# Patient Record
Sex: Female | Born: 1970 | Race: White | Hispanic: No | Marital: Single | Smoking: Never smoker
Health system: Southern US, Community
[De-identification: ages and names within clinical notes are randomized; demographics above are authoritative.]

## PROBLEM LIST (undated history)

## (undated) DIAGNOSIS — F419 Anxiety disorder, unspecified: Secondary | ICD-10-CM

## (undated) DIAGNOSIS — Z9889 Other specified postprocedural states: Secondary | ICD-10-CM

## (undated) DIAGNOSIS — F488 Other specified nonpsychotic mental disorders: Secondary | ICD-10-CM

## (undated) DIAGNOSIS — R112 Nausea with vomiting, unspecified: Secondary | ICD-10-CM

## (undated) HISTORY — PX: CHOLECYSTECTOMY: SHX55

## (undated) HISTORY — PX: AUGMENTATION MAMMAPLASTY: SUR837

## (undated) HISTORY — PX: ABDOMINAL HYSTERECTOMY: SHX81

## (undated) HISTORY — PX: DIAGNOSTIC LAPAROSCOPY: SUR761

## (undated) HISTORY — PX: BREAST ENHANCEMENT SURGERY: SHX7

---

## 2016-03-21 DIAGNOSIS — M654 Radial styloid tenosynovitis [de Quervain]: Secondary | ICD-10-CM | POA: Diagnosis not present

## 2016-04-17 DIAGNOSIS — L918 Other hypertrophic disorders of the skin: Secondary | ICD-10-CM | POA: Diagnosis not present

## 2016-04-17 DIAGNOSIS — L710 Perioral dermatitis: Secondary | ICD-10-CM | POA: Diagnosis not present

## 2016-08-21 DIAGNOSIS — N926 Irregular menstruation, unspecified: Secondary | ICD-10-CM | POA: Diagnosis not present

## 2016-08-21 DIAGNOSIS — N760 Acute vaginitis: Secondary | ICD-10-CM | POA: Diagnosis not present

## 2016-08-21 DIAGNOSIS — N72 Inflammatory disease of cervix uteri: Secondary | ICD-10-CM | POA: Diagnosis not present

## 2016-09-04 DIAGNOSIS — N72 Inflammatory disease of cervix uteri: Secondary | ICD-10-CM | POA: Diagnosis not present

## 2016-09-26 DIAGNOSIS — N72 Inflammatory disease of cervix uteri: Secondary | ICD-10-CM | POA: Diagnosis not present

## 2016-10-09 ENCOUNTER — Encounter (HOSPITAL_BASED_OUTPATIENT_CLINIC_OR_DEPARTMENT_OTHER): Payer: Self-pay | Admitting: *Deleted

## 2016-10-21 NOTE — Progress Notes (Signed)
LM on VM for Jenna Chaney, scheduler to inform Dr. Helane Rima that orders needed to be placed in EPIC!

## 2016-10-28 ENCOUNTER — Encounter (HOSPITAL_COMMUNITY)
Admission: RE | Admit: 2016-10-28 | Discharge: 2016-10-28 | Disposition: A | Discharge status: Home / Self Care | Payer: Self-pay | Source: Ambulatory Visit | Attending: Obstetrics and Gynecology | Admitting: Obstetrics and Gynecology

## 2016-10-28 DIAGNOSIS — N870 Mild cervical dysplasia: Secondary | ICD-10-CM | POA: Diagnosis not present

## 2016-10-28 DIAGNOSIS — Z01419 Encounter for gynecological examination (general) (routine) without abnormal findings: Secondary | ICD-10-CM | POA: Diagnosis not present

## 2016-10-28 DIAGNOSIS — N72 Inflammatory disease of cervix uteri: Secondary | ICD-10-CM | POA: Diagnosis not present

## 2016-10-30 DIAGNOSIS — Z1231 Encounter for screening mammogram for malignant neoplasm of breast: Secondary | ICD-10-CM | POA: Diagnosis not present

## 2016-12-06 DIAGNOSIS — M5412 Radiculopathy, cervical region: Secondary | ICD-10-CM | POA: Diagnosis not present

## 2016-12-06 DIAGNOSIS — M542 Cervicalgia: Secondary | ICD-10-CM | POA: Diagnosis not present

## 2016-12-29 NOTE — H&P (Signed)
46 year old G 0 presents for LAVH, BS. She reports chronic vaginal discharge, irregular bleeding and copious amounts of vaginal bleeding very time she has intercourse. Pelvic ultrasound normal Cervical biopsy +HPV She has been given multiple rounds of antibiotics without improvement.  No past medical history on file. . Past Surgical History: Breast augmentation  Allergic to codeine and tramadol  Prior to Admission medications   [place]sel[place], amitriptyline  Family history unremarkable  ROS above  VSS General alert and oriented Lung CTAB Car RRR Abdomen is soft and non tender Pelvic uterus is small Cervix is slightly friable  IMPRESSION: Abnormal uterine bleeding HPV Chronic cervicitis  PLAN: LAVH BS Consent signed Risks reviewed

## 2016-12-30 DIAGNOSIS — J0100 Acute maxillary sinusitis, unspecified: Secondary | ICD-10-CM | POA: Diagnosis not present

## 2017-01-08 DIAGNOSIS — N72 Inflammatory disease of cervix uteri: Secondary | ICD-10-CM | POA: Diagnosis not present

## 2017-01-09 ENCOUNTER — Encounter (HOSPITAL_COMMUNITY)
Admission: RE | Admit: 2017-01-09 | Discharge: 2017-01-09 | Disposition: A | Discharge status: Home / Self Care | Payer: 59 | Source: Ambulatory Visit | Attending: Obstetrics and Gynecology | Admitting: Obstetrics and Gynecology

## 2017-01-09 ENCOUNTER — Encounter (HOSPITAL_COMMUNITY): Payer: Self-pay

## 2017-01-09 DIAGNOSIS — N939 Abnormal uterine and vaginal bleeding, unspecified: Secondary | ICD-10-CM | POA: Insufficient documentation

## 2017-01-09 DIAGNOSIS — Z01818 Encounter for other preprocedural examination: Secondary | ICD-10-CM | POA: Diagnosis not present

## 2017-01-09 HISTORY — DX: Anxiety disorder, unspecified: F41.9

## 2017-01-09 HISTORY — DX: Nausea with vomiting, unspecified: R11.2

## 2017-01-09 HISTORY — DX: Other specified postprocedural states: Z98.890

## 2017-01-09 HISTORY — DX: Other specified nonpsychotic mental disorders: F48.8

## 2017-01-09 LAB — CBC
HCT: 35.9 % — ABNORMAL LOW (ref 36.0–46.0)
Hemoglobin: 11.9 g/dL — ABNORMAL LOW (ref 12.0–15.0)
MCH: 32.1 pg (ref 26.0–34.0)
MCHC: 33.1 g/dL (ref 30.0–36.0)
MCV: 96.8 fL (ref 78.0–100.0)
PLATELETS: 293 10*3/uL (ref 150–400)
RBC: 3.71 MIL/uL — AB (ref 3.87–5.11)
RDW: 12.7 % (ref 11.5–15.5)
WBC: 10.1 10*3/uL (ref 4.0–10.5)

## 2017-01-09 LAB — ABO/RH: ABO/RH(D): A POS

## 2017-01-09 NOTE — Patient Instructions (Addendum)
Jenna Chaney  01/09/2017      Jenna Chaney  01/09/2017      Your procedure is scheduled on Monday, Nov. 5, 2018   [facility]   Call this number if you have problems the morning of [phone]              OUR ADDRESS IS [address] , WE ARE LOCATED IN [place].    Remember:  Do not eat food or drink liquids after midnight.   Take these medicines the morning of surgery with A SIP OF WATER:  Lexapro  Do not wear jewelry, make-up or nail polish.  Do not wear lotions, powders, or perfumes, or deoderant.  Do not shave 48 hours prior to surgery.  Men may shave face and neck.  Do not bring valuables to the hospital.  [facility] is not responsible for any belongings or valuables.  Contacts, dentures or bridgework may not be worn into surgery.  Leave your suitcase in the car.  After surgery it may be brought to your room.  For patients admitted to the hospital, discharge time will be determined by your treatment team.   Special instructions:   Please read over the following fact sheets that you were given.                [name] - Preparing for Surgery Before surgery, you can play an important role.  Because skin is not sterile, your skin needs to be as free of germs as possible.  You can reduce the number of germs on your skin by washing with CHG (chlorahexidine gluconate) soap before surgery.  CHG is an antiseptic cleaner which kills germs and bonds with the skin to continue killing germs even after washing. Please DO NOT use if you have an allergy to CHG or antibacterial soaps.  If your skin becomes reddened/irritated stop using the CHG and inform your nurse when you arrive at Short Stay. Do not shave (including legs and underarms) for at least 48 hours prior to the first CHG shower.  You may shave your face/neck. Please follow these instructions carefully:  1.  Shower with  CHG Soap the night before surgery and the  morning of Surgery.  2.  If you choose to wash your hair, wash your hair first as usual with your  normal  shampoo.  3.  After you shampoo, rinse your hair and body thoroughly to remove the  shampoo.                           4.  Use CHG as you would any other liquid soap.  You can apply chg directly  to the skin and wash                       Gently with a scrungie or clean washcloth.  5.  Apply the CHG Soap to your body ONLY FROM THE NECK DOWN.   Do not use on face/ open                           Wound or open sores. Avoid contact with eyes, ears mouth and genitals (private parts).  Wash face,  Genitals (private parts) with your normal soap.             6.  Wash thoroughly, paying special attention to the area where your surgery  will be performed.  7.  Thoroughly rinse your body with warm water from the neck down.  8.  DO NOT shower/wash with your normal soap after using and rinsing off  the CHG Soap.                9.  Pat yourself dry with a clean towel.            10.  Wear clean pajamas.            11.  Place clean sheets on your bed the night of your first shower and do not  sleep with pets. Day of Surgery : Do not apply any lotions/deodorants the morning of surgery.  Please wear clean clothes to the hospital/surgery center.  FAILURE TO FOLLOW THESE INSTRUCTIONS MAY RESULT IN THE CANCELLATION OF YOUR SURGERY PATIENT SIGNATURE_________________________________  NURSE SIGNATURE__________________________________  ________________________________________________________________________   Jenna Chaney  An incentive spirometer is a tool that can help keep your lungs clear and active. This tool measures how well you are filling your lungs with each breath. Taking long deep breaths may help reverse or decrease the chance of developing breathing (pulmonary) problems (especially infection) following:  A long period of  time when you are unable to move or be active. BEFORE THE PROCEDURE   If the spirometer includes an indicator to show your best effort, your nurse or respiratory therapist will set it to a desired goal.  If possible, sit up straight or lean slightly forward. Try not to slouch.  Hold the incentive spirometer in an upright position. INSTRUCTIONS FOR USE  1. Sit on the edge of your bed if possible, or sit up as far as you can in bed or on a chair. 2. Hold the incentive spirometer in an upright position. 3. Breathe out normally. 4. Place the mouthpiece in your mouth and seal your lips tightly around it. 5. Breathe in slowly and as deeply as possible, raising the piston or the ball toward the top of the column. 6. Hold your breath for 3-5 seconds or for as long as possible. Allow the piston or ball to fall to the bottom of the column. 7. Remove the mouthpiece from your mouth and breathe out normally. 8. Rest for a few seconds and repeat Steps 1 through 7 at least 10 times every 1-2 hours when you are awake. Take your time and take a few normal breaths between deep breaths. 9. The spirometer may include an indicator to show your best effort. Use the indicator as a goal to work toward during each repetition. 10. After each set of 10 deep breaths, practice coughing to be sure your lungs are clear. If you have an incision (the cut made at the time of surgery), support your incision when coughing by placing a pillow or rolled up towels firmly against it. Once you are able to get out of bed, walk around indoors and cough well. You may stop using the incentive spirometer when instructed by your caregiver.  RISKS AND COMPLICATIONS  Take your time so you do not get dizzy or light-headed.  If you are in pain, you may need to take or ask for pain medication before doing incentive spirometry. It is harder to take a deep breath if you are having pain.  AFTER USE  Rest and breathe slowly and easily.  It can  be helpful to keep track of a log of your progress. Your caregiver can provide you with a simple table to help with this. If you are using the spirometer at home, follow these instructions: [place] IF:   You are having difficultly using the spirometer.  You have trouble using the spirometer as often as instructed.  Your pain medication is not giving enough relief while using the spirometer.  You develop fever of 100.5 F (38.1 C) or higher. SEEK IMMEDIATE MEDICAL CARE IF:   You cough up bloody sputum that had not been present before.  You develop fever of 102 F (38.9 C) or greater.  You develop worsening pain at or near the incision site. MAKE SURE YOU:   Understand these instructions.  Will watch your condition.  Will get help right away if you are not doing well or get worse. Document Released: 07/08/2006 Document Revised: 05/20/2011 Document Reviewed: 09/08/2006 Exit[employer] Patient Information 2014 Exit[employer], [place].   ________________________________________________________________________  WHAT IS A BLOOD TRANSFUSION? Blood Transfusion Information  A transfusion is the replacement of blood or some of its parts. Blood is made up of multiple cells which provide different functions.  Red blood cells carry oxygen and are used for blood loss replacement.  White blood cells fight against infection.  Platelets control bleeding.  Plasma helps clot blood.  Other blood products are available for specialized needs, such as hemophilia or other clotting disorders. BEFORE THE TRANSFUSION  Who gives blood for transfusions?   Healthy volunteers who are fully evaluated to make sure their blood is safe. This is blood bank blood. Transfusion therapy is the safest it has ever been in the practice of medicine. Before blood is taken from a donor, a complete history is taken to make sure that person has no history of diseases nor engages in risky social behavior (examples are  intravenous drug use or sexual activity with multiple partners). The donor's travel history is screened to minimize risk of transmitting infections, such as malaria. The donated blood is tested for signs of infectious diseases, such as HIV and hepatitis. The blood is then tested to be sure it is compatible with you in order to minimize the chance of a transfusion reaction. If you or a relative donates blood, this is often done in anticipation of surgery and is not appropriate for emergency situations. It takes many days to process the donated blood. RISKS AND COMPLICATIONS Although transfusion therapy is very safe and saves many lives, the main dangers of transfusion include:   Getting an infectious disease.  Developing a transfusion reaction. This is an allergic reaction to something in the blood you were given. Every precaution is taken to prevent this. The decision to have a blood transfusion has been considered carefully by your caregiver before blood is given. Blood is not given unless the benefits outweigh the risks. AFTER THE TRANSFUSION  Right after receiving a blood transfusion, you will usually feel much better and more energetic. This is especially true if your red blood cells have gotten low (anemic). The transfusion raises the level of the red blood cells which carry oxygen, and this usually causes an energy increase.  The nurse administering the transfusion will monitor you carefully for complications. HOME CARE INSTRUCTIONS  No special instructions are needed after a transfusion. You may find your energy is better. Speak with your caregiver about any limitations on activity for underlying diseases  you may have. SEEK MEDICAL CARE IF:   Your condition is not improving after your transfusion.  You develop redness or irritation at the intravenous (IV) site. SEEK IMMEDIATE MEDICAL CARE IF:  Any of the following symptoms occur over the next 12 hours:  Shaking chills.  You have a  temperature by mouth above 102 F (38.9 C), not controlled by medicine.  Chest, back, or muscle pain.  People around you feel you are not acting correctly or are confused.  Shortness of breath or difficulty breathing.  Dizziness and fainting.  You get a rash or develop hives.  You have a decrease in urine output.  Your urine turns a dark color or changes to pink, red, or brown. Any of the following symptoms occur over the next 10 days:  You have a temperature by mouth above 102 F (38.9 C), not controlled by medicine.  Shortness of breath.  Weakness after normal activity.  The white part of the eye turns yellow (jaundice).  You have a decrease in the amount of urine or are urinating less often.  Your urine turns a dark color or changes to pink, red, or brown. Document Released: 02/23/2000 Document Revised: 05/20/2011 Document Reviewed: 10/12/2007 [facility] Patient Information 2014 [place], [place].  _______________________________________________________________________

## 2017-01-13 ENCOUNTER — Ambulatory Visit (HOSPITAL_BASED_OUTPATIENT_CLINIC_OR_DEPARTMENT_OTHER)
Admission: RE | Admit: 2017-01-13 | Discharge: 2017-01-14 | Disposition: A | Discharge status: Home / Self Care | Payer: 59 | Source: Ambulatory Visit | Attending: Obstetrics and Gynecology | Admitting: Obstetrics and Gynecology

## 2017-01-13 ENCOUNTER — Encounter (HOSPITAL_BASED_OUTPATIENT_CLINIC_OR_DEPARTMENT_OTHER): Payer: Self-pay

## 2017-01-13 ENCOUNTER — Ambulatory Visit (HOSPITAL_BASED_OUTPATIENT_CLINIC_OR_DEPARTMENT_OTHER): Payer: 59 | Admitting: Anesthesiology

## 2017-01-13 ENCOUNTER — Encounter (HOSPITAL_BASED_OUTPATIENT_CLINIC_OR_DEPARTMENT_OTHER)
Admission: RE | Disposition: A | Discharge status: Home / Self Care | Payer: Self-pay | Source: Ambulatory Visit | Attending: Obstetrics and Gynecology

## 2017-01-13 DIAGNOSIS — Z885 Allergy status to narcotic agent status: Secondary | ICD-10-CM | POA: Diagnosis not present

## 2017-01-13 DIAGNOSIS — N870 Mild cervical dysplasia: Secondary | ICD-10-CM | POA: Diagnosis not present

## 2017-01-13 DIAGNOSIS — N838 Other noninflammatory disorders of ovary, fallopian tube and broad ligament: Secondary | ICD-10-CM | POA: Diagnosis not present

## 2017-01-13 DIAGNOSIS — R87810 Cervical high risk human papillomavirus (HPV) DNA test positive: Secondary | ICD-10-CM | POA: Diagnosis not present

## 2017-01-13 DIAGNOSIS — N72 Inflammatory disease of cervix uteri: Secondary | ICD-10-CM | POA: Diagnosis not present

## 2017-01-13 DIAGNOSIS — D259 Leiomyoma of uterus, unspecified: Secondary | ICD-10-CM | POA: Diagnosis not present

## 2017-01-13 DIAGNOSIS — N939 Abnormal uterine and vaginal bleeding, unspecified: Secondary | ICD-10-CM | POA: Diagnosis not present

## 2017-01-13 HISTORY — PX: LAPAROSCOPIC VAGINAL HYSTERECTOMY WITH SALPINGECTOMY: SHX6680

## 2017-01-13 LAB — TYPE AND SCREEN
ABO/RH(D): A POS
Antibody Screen: NEGATIVE

## 2017-01-13 LAB — POCT PREGNANCY, URINE: Preg Test, Ur: NEGATIVE

## 2017-01-13 SURGERY — HYSTERECTOMY, VAGINAL, LAPAROSCOPY-ASSISTED, WITH SALPINGECTOMY
Anesthesia: General | Site: Abdomen | Laterality: Bilateral

## 2017-01-13 MED ORDER — PROPOFOL 10 MG/ML IV BOLUS
INTRAVENOUS | Status: AC
  Filled 2017-01-13: qty 40

## 2017-01-13 MED ORDER — SODIUM CHLORIDE 0.9% FLUSH
9.0000 mL | INTRAVENOUS | Status: DC | PRN
  Filled 2017-01-13: qty 10

## 2017-01-13 MED ORDER — ROCURONIUM BROMIDE 10 MG/ML (PF) SYRINGE
PREFILLED_SYRINGE | INTRAVENOUS | Status: DC | PRN
  Administered 2017-01-13: 30 mg via INTRAVENOUS
  Administered 2017-01-13 (×2): 10 mg via INTRAVENOUS

## 2017-01-13 MED ORDER — MIDAZOLAM HCL 2 MG/2ML IJ SOLN
INTRAMUSCULAR | Status: AC
Start: 2017-01-13 — End: 2017-01-13
  Filled 2017-01-13: qty 2

## 2017-01-13 MED ORDER — FENTANYL CITRATE (PF) 100 MCG/2ML IJ SOLN
INTRAMUSCULAR | Status: DC | PRN
  Administered 2017-01-13 (×5): 50 ug via INTRAVENOUS

## 2017-01-13 MED ORDER — DIPHENHYDRAMINE HCL 50 MG/ML IJ SOLN
12.5000 mg | Freq: Four times a day (QID) | INTRAMUSCULAR | Status: DC | PRN
  Filled 2017-01-13: qty 0.25

## 2017-01-13 MED ORDER — METOCLOPRAMIDE HCL 5 MG/ML IJ SOLN
INTRAMUSCULAR | Status: AC
  Filled 2017-01-13: qty 2

## 2017-01-13 MED ORDER — DEXTROSE 5 % IV SOLN
2.0000 g | INTRAVENOUS | Status: AC
  Administered 2017-01-13: 2 g via INTRAVENOUS
  Filled 2017-01-13: qty 2

## 2017-01-13 MED ORDER — SCOPOLAMINE 1 MG/3DAYS TD PT72
1.0000 | MEDICATED_PATCH | TRANSDERMAL | Status: DC
  Administered 2017-01-13: 1.5 mg via TRANSDERMAL
  Filled 2017-01-13: qty 1

## 2017-01-13 MED ORDER — LIDOCAINE 2% (20 MG/ML) 5 ML SYRINGE
INTRAMUSCULAR | Status: DC | PRN
  Administered 2017-01-13: 60 mg via INTRAVENOUS

## 2017-01-13 MED ORDER — ACETAMINOPHEN 10 MG/ML IV SOLN
INTRAVENOUS | Status: AC
  Filled 2017-01-13: qty 100

## 2017-01-13 MED ORDER — DIPHENHYDRAMINE HCL 12.5 MG/5ML PO ELIX
12.5000 mg | ORAL_SOLUTION | Freq: Four times a day (QID) | ORAL | Status: DC | PRN
  Filled 2017-01-13: qty 5

## 2017-01-13 MED ORDER — PROPOFOL 500 MG/50ML IV EMUL
INTRAVENOUS | Status: AC
  Filled 2017-01-13: qty 50

## 2017-01-13 MED ORDER — CEFOTETAN DISODIUM-DEXTROSE 2-2.08 GM-%(50ML) IV SOLR
INTRAVENOUS | Status: AC
  Filled 2017-01-13: qty 50

## 2017-01-13 MED ORDER — LACTATED RINGERS IV SOLN
INTRAVENOUS | Status: DC
  Administered 2017-01-13 (×2): via INTRAVENOUS
  Filled 2017-01-13: qty 1000

## 2017-01-13 MED ORDER — SODIUM CHLORIDE 0.9 % IR SOLN
Status: DC | PRN
  Administered 2017-01-13: 3000 mL

## 2017-01-13 MED ORDER — BUPIVACAINE HCL (PF) 0.25 % IJ SOLN
INTRAMUSCULAR | Status: DC | PRN
  Administered 2017-01-13: 2 mL

## 2017-01-13 MED ORDER — FENTANYL CITRATE (PF) 100 MCG/2ML IJ SOLN
INTRAMUSCULAR | Status: AC
  Filled 2017-01-13: qty 2

## 2017-01-13 MED ORDER — HYDROMORPHONE 1 MG/ML IV SOLN
INTRAVENOUS | Status: DC
  Administered 2017-01-13: 11:00:00 via INTRAVENOUS
  Filled 2017-01-13 (×2): qty 25

## 2017-01-13 MED ORDER — KETOROLAC TROMETHAMINE 30 MG/ML IJ SOLN
30.0000 mg | Freq: Once | INTRAMUSCULAR | Status: AC
  Administered 2017-01-13: 30 mg via INTRAVENOUS
  Filled 2017-01-13: qty 1

## 2017-01-13 MED ORDER — DEXAMETHASONE SODIUM PHOSPHATE 10 MG/ML IJ SOLN
INTRAMUSCULAR | Status: AC
  Filled 2017-01-13: qty 1

## 2017-01-13 MED ORDER — IBUPROFEN 600 MG PO TABS
600.0000 mg | ORAL_TABLET | Freq: Four times a day (QID) | ORAL | Status: DC | PRN
  Filled 2017-01-13: qty 1

## 2017-01-13 MED ORDER — NALOXONE HCL 0.4 MG/ML IJ SOLN
0.4000 mg | INTRAMUSCULAR | Status: DC | PRN
  Filled 2017-01-13: qty 1

## 2017-01-13 MED ORDER — GLYCOPYRROLATE 0.2 MG/ML IV SOSY
PREFILLED_SYRINGE | INTRAVENOUS | Status: DC | PRN
  Administered 2017-01-13: .2 mg via INTRAVENOUS

## 2017-01-13 MED ORDER — ARTIFICIAL TEARS OPHTHALMIC OINT
TOPICAL_OINTMENT | OPHTHALMIC | Status: AC
Start: 2017-01-13 — End: 2017-01-13
  Filled 2017-01-13: qty 3.5

## 2017-01-13 MED ORDER — FENTANYL CITRATE (PF) 100 MCG/2ML IJ SOLN
25.0000 ug | INTRAMUSCULAR | Status: DC | PRN
  Administered 2017-01-13 (×2): 25 ug via INTRAVENOUS
  Administered 2017-01-13: 50 ug via INTRAVENOUS
  Filled 2017-01-13: qty 1

## 2017-01-13 MED ORDER — SUCCINYLCHOLINE CHLORIDE 200 MG/10ML IV SOSY
PREFILLED_SYRINGE | INTRAVENOUS | Status: AC
  Filled 2017-01-13: qty 10

## 2017-01-13 MED ORDER — ROCURONIUM BROMIDE 50 MG/5ML IV SOSY
PREFILLED_SYRINGE | INTRAVENOUS | Status: AC
Start: 2017-01-13 — End: 2017-01-13
  Filled 2017-01-13: qty 5

## 2017-01-13 MED ORDER — ONDANSETRON HCL 4 MG/2ML IJ SOLN
INTRAMUSCULAR | Status: DC | PRN
  Administered 2017-01-13: 4 mg via INTRAVENOUS

## 2017-01-13 MED ORDER — SUCCINYLCHOLINE CHLORIDE 200 MG/10ML IV SOSY
PREFILLED_SYRINGE | INTRAVENOUS | Status: DC | PRN
  Administered 2017-01-13: 100 mg via INTRAVENOUS

## 2017-01-13 MED ORDER — FENTANYL CITRATE (PF) 250 MCG/5ML IJ SOLN
INTRAMUSCULAR | Status: AC
  Filled 2017-01-13: qty 5

## 2017-01-13 MED ORDER — SUGAMMADEX SODIUM 200 MG/2ML IV SOLN
INTRAVENOUS | Status: DC | PRN
  Administered 2017-01-13: 150 mg via INTRAVENOUS

## 2017-01-13 MED ORDER — MENTHOL 3 MG MT LOZG
1.0000 | LOZENGE | OROMUCOSAL | Status: DC | PRN
  Filled 2017-01-13: qty 9

## 2017-01-13 MED ORDER — ACETAMINOPHEN 10 MG/ML IV SOLN
INTRAVENOUS | Status: DC | PRN
  Administered 2017-01-13: 1000 mg via INTRAVENOUS

## 2017-01-13 MED ORDER — MIDAZOLAM HCL 2 MG/2ML IJ SOLN
INTRAMUSCULAR | Status: DC | PRN
  Administered 2017-01-13: 2 mg via INTRAVENOUS

## 2017-01-13 MED ORDER — SUGAMMADEX SODIUM 200 MG/2ML IV SOLN
INTRAVENOUS | Status: AC
  Filled 2017-01-13: qty 2

## 2017-01-13 MED ORDER — GLYCOPYRROLATE 0.2 MG/ML IV SOSY
PREFILLED_SYRINGE | INTRAVENOUS | Status: AC
Start: 2017-01-13 — End: 2017-01-13
  Filled 2017-01-13: qty 5

## 2017-01-13 MED ORDER — LACTATED RINGERS IV SOLN
INTRAVENOUS | Status: DC
  Administered 2017-01-13: 13:00:00 via INTRAVENOUS
  Filled 2017-01-13 (×2): qty 1000

## 2017-01-13 MED ORDER — HYDROMORPHONE HCL 2 MG PO TABS
2.0000 mg | ORAL_TABLET | ORAL | Status: DC | PRN
  Administered 2017-01-13 – 2017-01-14 (×4): 2 mg via ORAL
  Filled 2017-01-13: qty 1

## 2017-01-13 MED ORDER — KETOROLAC TROMETHAMINE 30 MG/ML IJ SOLN
INTRAMUSCULAR | Status: AC
  Filled 2017-01-13: qty 1

## 2017-01-13 MED ORDER — PROMETHAZINE HCL 25 MG/ML IJ SOLN
6.2500 mg | INTRAMUSCULAR | Status: DC | PRN
  Filled 2017-01-13: qty 1

## 2017-01-13 MED ORDER — DEXAMETHASONE SODIUM PHOSPHATE 10 MG/ML IJ SOLN
INTRAMUSCULAR | Status: DC | PRN
  Administered 2017-01-13: 10 mg via INTRAVENOUS

## 2017-01-13 MED ORDER — ONDANSETRON HCL 4 MG/2ML IJ SOLN
INTRAMUSCULAR | Status: AC
  Filled 2017-01-13: qty 2

## 2017-01-13 MED ORDER — LIDOCAINE 2% (20 MG/ML) 5 ML SYRINGE
INTRAMUSCULAR | Status: AC
Start: 2017-01-13 — End: 2017-01-13
  Filled 2017-01-13: qty 10

## 2017-01-13 MED ORDER — METOCLOPRAMIDE HCL 5 MG/ML IJ SOLN
INTRAMUSCULAR | Status: DC | PRN
  Administered 2017-01-13: 5 mg via INTRAVENOUS

## 2017-01-13 MED ORDER — PROPOFOL 500 MG/50ML IV EMUL
INTRAVENOUS | Status: DC | PRN
  Administered 2017-01-13: 150 ug/kg/min via INTRAVENOUS

## 2017-01-13 MED ORDER — SCOPOLAMINE 1 MG/3DAYS TD PT72
MEDICATED_PATCH | TRANSDERMAL | Status: AC
Start: 2017-01-13 — End: 2017-01-13
  Filled 2017-01-13: qty 1

## 2017-01-13 MED ORDER — PROPOFOL 10 MG/ML IV BOLUS
INTRAVENOUS | Status: DC | PRN
  Administered 2017-01-13: 200 mg via INTRAVENOUS
  Administered 2017-01-13: 10 mg via INTRAVENOUS

## 2017-01-13 MED ORDER — HYDROMORPHONE HCL 2 MG PO TABS
ORAL_TABLET | ORAL | Status: AC
Start: 2017-01-13 — End: 2017-01-13
  Filled 2017-01-13: qty 1

## 2017-01-13 MED ORDER — KETOROLAC TROMETHAMINE 30 MG/ML IJ SOLN
INTRAMUSCULAR | Status: DC | PRN
  Administered 2017-01-13: 30 mg via INTRAVENOUS

## 2017-01-13 MED ORDER — HYDROMORPHONE HCL 2 MG PO TABS
ORAL_TABLET | ORAL | Status: AC
  Filled 2017-01-13: qty 1

## 2017-01-13 MED ORDER — ONDANSETRON HCL 4 MG/2ML IJ SOLN
4.0000 mg | Freq: Four times a day (QID) | INTRAMUSCULAR | Status: DC | PRN
  Filled 2017-01-13: qty 2

## 2017-01-13 MED ORDER — ESCITALOPRAM OXALATE 5 MG PO TABS
5.0000 mg | ORAL_TABLET | Freq: Every day | ORAL | Status: DC
  Administered 2017-01-13: 5 mg via ORAL
  Filled 2017-01-13: qty 1

## 2017-01-13 SURGICAL SUPPLY — 59 items
ADH SKN CLS APL DERMABOND .7 (GAUZE/BANDAGES/DRESSINGS) ×2
APL SRG 38 LTWT LNG FL B (MISCELLANEOUS) ×1
APPLICATOR ARISTA FLEXITIP XL (MISCELLANEOUS) ×2 IMPLANT
BARRIER ADHS 3X4 INTERCEED (GAUZE/BANDAGES/DRESSINGS) IMPLANT
BLADE CLIPPER SURG (BLADE) IMPLANT
BRR ADH 4X3 ABS CNTRL BYND (GAUZE/BANDAGES/DRESSINGS)
CANISTER SUCT 3000ML PPV (MISCELLANEOUS) IMPLANT
CLOTH BEACON ORANGE TIMEOUT ST (SAFETY) ×3 IMPLANT
COVER BACK TABLE 80X110 HD (DRAPES) ×3 IMPLANT
COVER MAYO STAND STRL (DRAPES) ×6 IMPLANT
DERMABOND ADVANCED (GAUZE/BANDAGES/DRESSINGS) ×4
DERMABOND ADVANCED .7 DNX12 (GAUZE/BANDAGES/DRESSINGS) ×1 IMPLANT
DRSG COVADERM PLUS 2X2 (GAUZE/BANDAGES/DRESSINGS) IMPLANT
DRSG OPSITE POSTOP 3X4 (GAUZE/BANDAGES/DRESSINGS) ×5 IMPLANT
DURAPREP 26ML APPLICATOR (WOUND CARE) ×3 IMPLANT
ELECT REM PT RETURN 9FT ADLT (ELECTROSURGICAL) ×3
ELECTRODE REM PT RTRN 9FT ADLT (ELECTROSURGICAL) ×1 IMPLANT
GLOVE BIO SURGEON STRL SZ 6 (GLOVE) ×4 IMPLANT
GLOVE BIO SURGEON STRL SZ 6.5 (GLOVE) ×8 IMPLANT
GLOVE BIO SURGEONS STRL SZ 6.5 (GLOVE) ×5
GLOVE BIOGEL PI IND STRL 6.5 (GLOVE) IMPLANT
GLOVE BIOGEL PI IND STRL 7.5 (GLOVE) IMPLANT
GLOVE BIOGEL PI INDICATOR 6.5 (GLOVE) ×6
GLOVE BIOGEL PI INDICATOR 7.5 (GLOVE) ×2
GLOVE ECLIPSE 6.5 STRL STRAW (GLOVE) ×6 IMPLANT
GOWN STRL REUS W/TWL LRG LVL3 (GOWN DISPOSABLE) ×10 IMPLANT
HEMOSTAT ARISTA ABSORB 3G PWDR (MISCELLANEOUS) ×2 IMPLANT
HOLDER FOLEY CATH W/STRAP (MISCELLANEOUS) ×3 IMPLANT
KIT RM TURNOVER CYSTO AR (KITS) ×3 IMPLANT
LEGGING LITHOTOMY PAIR STRL (DRAPES) ×3 IMPLANT
NDL INSUFFLATION 14GA 120MM (NEEDLE) ×1 IMPLANT
NEEDLE INSUFFLATION 14GA 120MM (NEEDLE) ×3 IMPLANT
NS IRRIG 500ML POUR BTL (IV SOLUTION) ×3 IMPLANT
PACK LAVH (CUSTOM PROCEDURE TRAY) ×3 IMPLANT
PACK ROBOTIC GOWN (GOWN DISPOSABLE) ×3 IMPLANT
PACK TRENDGUARD 450 HYBRID PRO (MISCELLANEOUS) IMPLANT
PAD OB MATERNITY 4.3X12.25 (PERSONAL CARE ITEMS) ×3 IMPLANT
PAD PREP 24X48 CUFFED NSTRL (MISCELLANEOUS) ×3 IMPLANT
SEALER TISSUE G2 CVD JAW 45CM (ENDOMECHANICALS) ×3 IMPLANT
SET IRRIG TUBING LAPAROSCOPIC (IRRIGATION / IRRIGATOR) ×3 IMPLANT
SUT VIC AB 0 CT1 18XCR BRD8 (SUTURE) ×2 IMPLANT
SUT VIC AB 0 CT1 36 (SUTURE) ×6 IMPLANT
SUT VIC AB 0 CT1 8-18 (SUTURE) ×6
SUT VIC AB 3-0 PS2 18 (SUTURE) ×3
SUT VIC AB 3-0 PS2 18XBRD (SUTURE) ×1 IMPLANT
SUT VIC AB 3-0 SH 27 (SUTURE)
SUT VIC AB 3-0 SH 27X BRD (SUTURE) IMPLANT
SUT VICRYL 0 TIES 12 18 (SUTURE) ×3 IMPLANT
SUT VICRYL 0 UR6 27IN ABS (SUTURE) ×3 IMPLANT
SYR BULB IRRIGATION 50ML (SYRINGE) IMPLANT
TOWEL OR 17X24 6PK STRL BLUE (TOWEL DISPOSABLE) ×6 IMPLANT
TRAY FOLEY CATH SILVER 14FR (SET/KITS/TRAYS/PACK) ×3 IMPLANT
TRENDGUARD 450 HYBRID PRO PACK (MISCELLANEOUS) ×3
TROCAR OPTI TIP 5M 100M (ENDOMECHANICALS) ×3 IMPLANT
TROCAR XCEL BLUNT TIP 100MML (ENDOMECHANICALS) IMPLANT
TROCAR XCEL DIL TIP R 11M (ENDOMECHANICALS) ×3 IMPLANT
TUBING INSUF HEATED (TUBING) ×3 IMPLANT
WARMER LAPAROSCOPE (MISCELLANEOUS) ×3 IMPLANT
WATER STERILE IRR 500ML POUR (IV SOLUTION) ×3 IMPLANT

## 2017-01-13 NOTE — OR Nursing (Signed)
Up ambulating with minimal assistance in hallway. Tolerated well.

## 2017-01-13 NOTE — Op Note (Signed)
Jenna Chaney, Jenna Chaney             ACCOUNT NO.:  [id number]  MEDICAL RECORD NO.:  [id number]  LOCATION:                                 FACILITY:  PHYSICIAN:  [place] Helane Rima, M.D.    DATE OF BIRTH:  DATE OF PROCEDURE:  01/13/2017 DATE OF DISCHARGE:                              OPERATIVE REPORT   PREOPERATIVE DIAGNOSES:  Abnormal uterine bleeding, chronic cervicitis, human papillomavirus.  POSTOPERATIVE DIAGNOSES:  Abnormal uterine bleeding, chronic cervicitis, human papillomavirus.  PROCEDURE:  Laparoscopic-assisted vaginal hysterectomy and bilateral salpingectomy.  SURGEON:  Jodi Kappes L. Helane Rima, M.D.  ASSISTANTLynnette Caffey.  ANESTHESIA:  General.  ESTIMATED BLOOD LOSS:  50 mL.  COMPLICATIONS:  None.  DRAINS:  Foley catheter.  DESCRIPTION OF PROCEDURE:  The patient was taken to the operating room. She was intubated.  She was prepped and draped in usual sterile fashion. A uterine manipulator was inserted and a Foley catheter was inserted and draining clear urine.  Attention was turned to the abdomen.  A small incision was made at the umbilicus.  The Veress needle was inserted and pneumoperitoneum was performed.  The Veress needle was removed and then an 11 mm trocar was inserted and the patient was then placed in Trendelenburg position.  Gently, the scope was introduced through the trocar sheath and the pelvis was normal.  A 5 mm trocar was inserted under direct visualization.  Using atraumatic grasper, the right fallopian tube was identified and lifted up.  Using the EnSeal, we placed beneath the mesosalpinx and carried it down to the round ligament and across the triple ligament/pedicle.  This was done with excellent hemostasis.  This was done identically on the left side as well.  We then released the pneumoperitoneum, went down to the vagina, placed a weighted speculum in the vagina.  A circumferential incision was made around the cervix.  A curved Haney clamps were  placed across the cardinal and uterosacral ligament complexes staying snug beside the cervix.  Each pedicle was clamped, cut, and suture ligated using 0 Vicryl suture.  We walked our way up the broad ligament, each staying very close to the cervix and uterus.  Each pedicle was clamped, cut, and suture ligated using 0 Vicryl suture.  We then removed the specimen and identified the cervix, uterus, and fallopian tubes.  I then placed angle stitches at the 3 and 9 o'clock position using 0 Vicryl stitch suture. I then closed the posterior cuff using 0 Vicryl suture in a running stitch.  I then closed the cuff completely anterior to posterior running using 0 Vicryl suture.  We then went back up to the abdomen.  Irrigation was performed.  The pelvis was very hemostatic.  I then placed the Arista across the pelvis just for postoperative hemostasis.  We then removed the pneumoperitoneum, released the trocars.  The suprapubic site was closed using 0 Vicryl suture and Dermabond was applied to the umbilicus.  All sponge, lap, and instrument counts were correct x2.  The patient went to recovery room in stable condition.     Roseann Kees L. Helane Rima, M.D.     Nevin Bloodgood  D:  01/13/2017  T:  01/13/2017  Job:  [id number]

## 2017-01-13 NOTE — Progress Notes (Signed)
49 Dc'd PCA dilaulid wasted 22 mg witnessed by Trude Mcburney, RN

## 2017-01-13 NOTE — Progress Notes (Signed)
Patient without complaint  H and P on chart BP 106/68 (BP Location: Left Arm, Patient Position: Sitting)   Pulse (!) 50   Temp 98.3 F (36.8 C) (Oral)   Resp 17   Ht 5\' 5"  (1.651 m)   Wt 58.1 kg (128 lb)   SpO2 100%   BMI 21.30 kg/m  Results for orders placed or performed during the hospital encounter of 01/13/17 (from the past 24 hour(s))  Pregnancy, urine POC     Status: None   Collection Time: 01/13/17  6:05 AM  Result Value Ref Range   Preg Test, Ur NEGATIVE NEGATIVE   Will proceed with LAVH , BS  Consent signed

## 2017-01-13 NOTE — Anesthesia Postprocedure Evaluation (Signed)
Anesthesia Post Note  Patient: Jenna Chaney  Procedure(s) Performed: LAPAROSCOPIC ASSISTED VAGINAL HYSTERECTOMY WITH BILATERAL SALPINGECTOMY (Bilateral Abdomen)     Patient location during evaluation: PACU Anesthesia Type: General Level of consciousness: sedated Pain management: pain level controlled Vital Signs Assessment: post-procedure vital signs reviewed and stable Respiratory status: spontaneous breathing and respiratory function stable Cardiovascular status: stable Postop Assessment: no apparent nausea or vomiting Anesthetic complications: no    Last Vitals:  Vitals:   01/13/17 0945 01/13/17 1000  BP: 131/74 117/70  Pulse: (!) 48 (!) 42  Resp: 15 17  Temp:    SpO2: 99% 97%    Last Pain:  Vitals:   01/13/17 1000  TempSrc:   PainSc: 6                  Seferino Oscar DANIEL

## 2017-01-13 NOTE — Anesthesia Preprocedure Evaluation (Signed)
Anesthesia Evaluation  Patient identified by MRN, date of birth, ID band Patient awake    Reviewed: Allergy & Precautions, NPO status , Patient's Chart, lab work & pertinent test results  History of Anesthesia Complications (+) PONV and history of anesthetic complications  Airway Mallampati: II  TM Distance: >3 FB Neck ROM: Full    Dental no notable dental hx. (+) Dental Advisory Given   Pulmonary neg pulmonary ROS,    Pulmonary exam normal        Cardiovascular negative cardio ROS Normal cardiovascular exam     Neuro/Psych negative neurological ROS  negative psych ROS   GI/Hepatic negative GI ROS, Neg liver ROS,   Endo/Other  negative endocrine ROS  Renal/GU negative Renal ROS  negative genitourinary   Musculoskeletal negative musculoskeletal ROS (+)   Abdominal   Peds negative pediatric ROS (+)  Hematology negative hematology ROS (+)   Anesthesia Other Findings   Reproductive/Obstetrics negative OB ROS                             Anesthesia Physical Anesthesia Plan  ASA: II  Anesthesia Plan: General   Post-op Pain Management:    Induction: Intravenous  PONV Risk Score and Plan: 4 or greater and Ondansetron, Dexamethasone, Propofol infusion and Scopolamine patch - Pre-op  Airway Management Planned: Oral ETT and LMA  Additional Equipment:   Intra-op Plan:   Post-operative Plan: Extubation in OR  Informed Consent: I have reviewed the patients History and Physical, chart, labs and discussed the procedure including the risks, benefits and alternatives for the proposed anesthesia with the patient or authorized representative who has indicated his/her understanding and acceptance.   Dental advisory given  Plan Discussed with: Anesthesiologist  Anesthesia Plan Comments:         Anesthesia Quick Evaluation

## 2017-01-13 NOTE — Anesthesia Procedure Notes (Signed)
Procedure Name: Intubation Date/Time: 01/13/2017 7:26 AM Performed by: Duane Boston, MD Pre-anesthesia Checklist: Patient identified, Emergency Drugs available, Suction available and Patient being monitored Patient Re-evaluated:Patient Re-evaluated prior to induction Oxygen Delivery Method: Circle system utilized Preoxygenation: Pre-oxygenation with 100% oxygen Induction Type: IV induction Ventilation: Mask ventilation without difficulty Laryngoscope Size: Mac and 3 Grade View: Grade I Tube type: Oral Tube size: 7.0 mm Number of attempts: 1 Airway Equipment and Method: Stylet and LTA kit utilized Placement Confirmation: ETT inserted through vocal cords under direct vision,  positive ETCO2 and breath sounds checked- equal and bilateral Secured at: 21 cm Tube secured with: Tape Dental Injury: Teeth and Oropharynx as per pre-operative assessment

## 2017-01-13 NOTE — Transfer of Care (Signed)
  Last Vitals:  Vitals:   01/13/17 0500 01/13/17 0902  BP: 106/68 106/73  Pulse: (!) 50 74  Resp: 17 12  Temp: 36.8 C (!) 36.3 C  SpO2: 100% 100%    Last Pain:  Vitals:   01/13/17 0500  TempSrc: Oral      Patients Stated Pain Goal: 3 (01/13/17 1683)  Immediate Anesthesia Transfer of Care Note  Patient: Jenna Chaney  Procedure(s) Performed: Procedure(s) (LRB): LAPAROSCOPIC ASSISTED VAGINAL HYSTERECTOMY WITH BILATERAL SALPINGECTOMY (Bilateral)  Patient Location: PACU  Anesthesia Type: General  Level of Consciousness: awake, alert  and oriented  Airway & Oxygen Therapy: Patient Spontanous Breathing and Patient connected to nasal cannula oxygen  Post-op Assessment: Report given to PACU RN and Post -op Vital signs reviewed and stable  Post vital signs: Reviewed and stable  Complications: No apparent anesthesia complications

## 2017-01-13 NOTE — Brief Op Note (Signed)
01/13/2017  8:49 AM  PATIENT:  Jenna Chaney  46 y.o. female  PRE-OPERATIVE DIAGNOSIS:   Abnormal uterine bleeding Chronic cervicitis HPV  POST-OPERATIVE DIAGNOSIS:  Same  PROCEDURE:  LAVH Bilateral salpingectomy  SURGEON:  Surgeon(s) and Role:    * Dian Queen, MD - Primary    * Morris, Jinny Blossom, DO - Assisting  PHYSICIAN ASSISTANT:   ASSISTANTS: none   ANESTHESIA:   general  EBL:  50 mL   BLOOD ADMINISTERED:none  DRAINS: Urinary Catheter (Foley)   LOCAL MEDICATIONS USED:  LIDOCAINE   SPECIMEN:  Source of Specimen:  cervix, uterus , fallopian tubes  DISPOSITION OF SPECIMEN:  PATHOLOGY  COUNTS:  YES  TOURNIQUET:  * No tourniquets in log *  DICTATION: .Other Dictation: Dictation Number [id number]  PLAN OF CARE: Admit to inpatient   PATIENT DISPOSITION:  PACU - hemodynamically stable.   Delay start of Pharmacological VTE agent (>24hrs) due to surgical blood loss or risk of bleeding: not applicable

## 2017-01-14 ENCOUNTER — Encounter (HOSPITAL_BASED_OUTPATIENT_CLINIC_OR_DEPARTMENT_OTHER): Payer: Self-pay | Admitting: Obstetrics and Gynecology

## 2017-01-14 DIAGNOSIS — N939 Abnormal uterine and vaginal bleeding, unspecified: Secondary | ICD-10-CM | POA: Diagnosis not present

## 2017-01-14 LAB — CBC
HEMATOCRIT: 34.2 % — AB (ref 36.0–46.0)
Hemoglobin: 11.2 g/dL — ABNORMAL LOW (ref 12.0–15.0)
MCH: 31.6 pg (ref 26.0–34.0)
MCHC: 32.7 g/dL (ref 30.0–36.0)
MCV: 96.6 fL (ref 78.0–100.0)
PLATELETS: 246 10*3/uL (ref 150–400)
RBC: 3.54 MIL/uL — ABNORMAL LOW (ref 3.87–5.11)
RDW: 12.7 % (ref 11.5–15.5)
WBC: 14.5 10*3/uL — ABNORMAL HIGH (ref 4.0–10.5)

## 2017-01-14 MED ORDER — HYDROMORPHONE HCL 2 MG PO TABS
ORAL_TABLET | ORAL | Status: AC
  Filled 2017-01-14: qty 1

## 2017-01-14 MED ORDER — HYDROMORPHONE HCL 2 MG PO TABS: 2.0000 mg | ORAL_TABLET | ORAL | 0 refills | Status: DC | PRN

## 2017-01-14 MED ORDER — IBUPROFEN 600 MG PO TABS: 600.0000 mg | ORAL_TABLET | Freq: Four times a day (QID) | ORAL | 0 refills | Status: DC | PRN

## 2017-01-14 NOTE — Discharge Summary (Signed)
Admission Diagnosis: HPV Chronic cervicitis Abnormal uterine bleeding  Discharge Diagnosis: Same  Hospital Course: 46 year old female admitted for LAVH and BS. She underwent surgery without incident.  By POD # 0 she was ambulating, voiding and tolerating regular diet. Her pain was controlled with po Dilaudid.  She was observed overnight and did very well.  BP 124/68 (BP Location: Left Arm)   Pulse (!) 55   Temp 98.9 F (37.2 C) (Oral)   Resp 16   Ht 5\' 5"  (1.651 m)   Wt 58.1 kg (128 lb)   SpO2 97%   BMI 21.30 kg/m  Results for orders placed or performed during the hospital encounter of 01/13/17 (from the past 24 hour(s))  CBC     Status: Abnormal   Collection Time: 01/14/17  4:13 AM  Result Value Ref Range   WBC 14.5 (H) 4.0 - 10.5 K/uL   RBC 3.54 (L) 3.87 - 5.11 MIL/uL   Hemoglobin 11.2 (L) 12.0 - 15.0 g/dL   HCT 34.2 (L) 36.0 - 46.0 %   MCV 96.6 78.0 - 100.0 fL   MCH 31.6 26.0 - 34.0 pg   MCHC 32.7 30.0 - 36.0 g/dL   RDW 12.7 11.5 - 15.5 %   Platelets 246 150 - 400 K/uL   Abdomen is soft and non tender and not distended Bandages clean and dry  Patient was discharged home in good condition Rx Ibuprofen and Rx dilaudid given to patient Follow up in 1 week Given usual post op instructions

## 2017-04-06 DIAGNOSIS — N3001 Acute cystitis with hematuria: Secondary | ICD-10-CM | POA: Diagnosis not present

## 2017-04-06 DIAGNOSIS — R300 Dysuria: Secondary | ICD-10-CM | POA: Diagnosis not present

## 2017-07-04 DIAGNOSIS — L811 Chloasma: Secondary | ICD-10-CM | POA: Diagnosis not present

## 2017-07-04 DIAGNOSIS — L821 Other seborrheic keratosis: Secondary | ICD-10-CM | POA: Diagnosis not present

## 2017-07-04 DIAGNOSIS — L719 Rosacea, unspecified: Secondary | ICD-10-CM | POA: Diagnosis not present

## 2017-07-17 DIAGNOSIS — R300 Dysuria: Secondary | ICD-10-CM | POA: Diagnosis not present

## 2017-07-17 DIAGNOSIS — N3091 Cystitis, unspecified with hematuria: Secondary | ICD-10-CM | POA: Diagnosis not present

## 2017-09-03 DIAGNOSIS — N760 Acute vaginitis: Secondary | ICD-10-CM | POA: Diagnosis not present

## 2017-09-17 DIAGNOSIS — N760 Acute vaginitis: Secondary | ICD-10-CM | POA: Diagnosis not present

## 2017-11-04 DIAGNOSIS — Z6821 Body mass index (BMI) 21.0-21.9, adult: Secondary | ICD-10-CM | POA: Diagnosis not present

## 2017-11-04 DIAGNOSIS — N760 Acute vaginitis: Secondary | ICD-10-CM | POA: Diagnosis not present

## 2017-11-04 DIAGNOSIS — Z1231 Encounter for screening mammogram for malignant neoplasm of breast: Secondary | ICD-10-CM | POA: Diagnosis not present

## 2017-11-04 DIAGNOSIS — Z01419 Encounter for gynecological examination (general) (routine) without abnormal findings: Secondary | ICD-10-CM | POA: Diagnosis not present

## 2017-11-04 DIAGNOSIS — Z1212 Encounter for screening for malignant neoplasm of rectum: Secondary | ICD-10-CM | POA: Diagnosis not present

## 2020-03-20 DIAGNOSIS — M25512 Pain in left shoulder: Secondary | ICD-10-CM | POA: Diagnosis not present

## 2020-03-20 DIAGNOSIS — M5459 Other low back pain: Secondary | ICD-10-CM | POA: Diagnosis not present

## 2020-04-17 DIAGNOSIS — M79601 Pain in right arm: Secondary | ICD-10-CM | POA: Diagnosis not present

## 2020-05-15 DIAGNOSIS — M5459 Other low back pain: Secondary | ICD-10-CM | POA: Diagnosis not present

## 2020-05-24 DIAGNOSIS — F4322 Adjustment disorder with anxiety: Secondary | ICD-10-CM | POA: Diagnosis not present

## 2020-05-24 DIAGNOSIS — L659 Nonscarring hair loss, unspecified: Secondary | ICD-10-CM | POA: Diagnosis not present

## 2020-05-24 DIAGNOSIS — J302 Other seasonal allergic rhinitis: Secondary | ICD-10-CM | POA: Diagnosis not present

## 2020-05-24 DIAGNOSIS — K5909 Other constipation: Secondary | ICD-10-CM | POA: Diagnosis not present

## 2020-06-19 DIAGNOSIS — M545 Low back pain, unspecified: Secondary | ICD-10-CM | POA: Diagnosis not present

## 2020-06-26 DIAGNOSIS — M5136 Other intervertebral disc degeneration, lumbar region: Secondary | ICD-10-CM | POA: Diagnosis not present

## 2020-10-23 ENCOUNTER — Other Ambulatory Visit: Payer: Self-pay | Admitting: Obstetrics and Gynecology

## 2020-10-23 DIAGNOSIS — N644 Mastodynia: Secondary | ICD-10-CM | POA: Diagnosis not present

## 2020-10-30 DIAGNOSIS — F4322 Adjustment disorder with anxiety: Secondary | ICD-10-CM | POA: Diagnosis not present

## 2020-10-30 DIAGNOSIS — Z131 Encounter for screening for diabetes mellitus: Secondary | ICD-10-CM | POA: Diagnosis not present

## 2020-10-30 DIAGNOSIS — Z01419 Encounter for gynecological examination (general) (routine) without abnormal findings: Secondary | ICD-10-CM | POA: Diagnosis not present

## 2020-10-30 DIAGNOSIS — Z13220 Encounter for screening for lipoid disorders: Secondary | ICD-10-CM | POA: Diagnosis not present

## 2020-10-30 DIAGNOSIS — Z1331 Encounter for screening for depression: Secondary | ICD-10-CM | POA: Diagnosis not present

## 2020-10-30 DIAGNOSIS — S46812A Strain of other muscles, fascia and tendons at shoulder and upper arm level, left arm, initial encounter: Secondary | ICD-10-CM | POA: Diagnosis not present

## 2020-10-31 ENCOUNTER — Ambulatory Visit
Admission: RE | Admit: 2020-10-31 | Discharge: 2020-10-31 | Disposition: A | Discharge status: Home / Self Care | Payer: Self-pay | Source: Ambulatory Visit | Attending: Obstetrics and Gynecology | Admitting: Obstetrics and Gynecology

## 2020-10-31 ENCOUNTER — Other Ambulatory Visit: Payer: Self-pay | Admitting: Obstetrics and Gynecology

## 2020-10-31 ENCOUNTER — Other Ambulatory Visit: Payer: Self-pay

## 2020-10-31 ENCOUNTER — Ambulatory Visit
Admission: RE | Admit: 2020-10-31 | Discharge: 2020-10-31 | Disposition: A | Discharge status: Home / Self Care | Payer: BC Managed Care – PPO | Source: Ambulatory Visit | Attending: Obstetrics and Gynecology | Admitting: Obstetrics and Gynecology

## 2020-10-31 DIAGNOSIS — N644 Mastodynia: Secondary | ICD-10-CM | POA: Diagnosis not present

## 2020-10-31 DIAGNOSIS — R922 Inconclusive mammogram: Secondary | ICD-10-CM | POA: Diagnosis not present

## 2020-10-31 DIAGNOSIS — N6489 Other specified disorders of breast: Secondary | ICD-10-CM

## 2021-01-23 DIAGNOSIS — M25511 Pain in right shoulder: Secondary | ICD-10-CM | POA: Diagnosis not present

## 2021-01-23 DIAGNOSIS — M25561 Pain in right knee: Secondary | ICD-10-CM | POA: Diagnosis not present

## 2021-03-07 DIAGNOSIS — L578 Other skin changes due to chronic exposure to nonionizing radiation: Secondary | ICD-10-CM | POA: Diagnosis not present

## 2021-03-07 DIAGNOSIS — L814 Other melanin hyperpigmentation: Secondary | ICD-10-CM | POA: Diagnosis not present

## 2021-03-07 DIAGNOSIS — D485 Neoplasm of uncertain behavior of skin: Secondary | ICD-10-CM | POA: Diagnosis not present

## 2021-03-30 DIAGNOSIS — B349 Viral infection, unspecified: Secondary | ICD-10-CM | POA: Diagnosis not present

## 2021-03-30 DIAGNOSIS — Z20822 Contact with and (suspected) exposure to covid-19: Secondary | ICD-10-CM | POA: Diagnosis not present

## 2021-03-30 DIAGNOSIS — R059 Cough, unspecified: Secondary | ICD-10-CM | POA: Diagnosis not present

## 2021-03-30 DIAGNOSIS — R0981 Nasal congestion: Secondary | ICD-10-CM | POA: Diagnosis not present

## 2021-03-30 DIAGNOSIS — Z6821 Body mass index (BMI) 21.0-21.9, adult: Secondary | ICD-10-CM | POA: Diagnosis not present

## 2021-03-30 DIAGNOSIS — J029 Acute pharyngitis, unspecified: Secondary | ICD-10-CM | POA: Diagnosis not present

## 2021-05-09 DIAGNOSIS — Z6822 Body mass index (BMI) 22.0-22.9, adult: Secondary | ICD-10-CM | POA: Diagnosis not present

## 2021-05-09 DIAGNOSIS — Z01419 Encounter for gynecological examination (general) (routine) without abnormal findings: Secondary | ICD-10-CM | POA: Diagnosis not present

## 2021-05-09 DIAGNOSIS — Z1272 Encounter for screening for malignant neoplasm of vagina: Secondary | ICD-10-CM | POA: Diagnosis not present

## 2021-05-23 ENCOUNTER — Other Ambulatory Visit: Payer: Self-pay | Admitting: Obstetrics and Gynecology

## 2021-05-23 ENCOUNTER — Ambulatory Visit
Admission: RE | Admit: 2021-05-23 | Discharge: 2021-05-23 | Disposition: A | Discharge status: Home / Self Care | Payer: BC Managed Care – PPO | Source: Ambulatory Visit | Attending: Obstetrics and Gynecology | Admitting: Obstetrics and Gynecology

## 2021-05-23 DIAGNOSIS — N6489 Other specified disorders of breast: Secondary | ICD-10-CM

## 2021-05-23 DIAGNOSIS — R922 Inconclusive mammogram: Secondary | ICD-10-CM | POA: Diagnosis not present

## 2021-08-21 DIAGNOSIS — L301 Dyshidrosis [pompholyx]: Secondary | ICD-10-CM | POA: Diagnosis not present

## 2021-08-21 DIAGNOSIS — R109 Unspecified abdominal pain: Secondary | ICD-10-CM | POA: Diagnosis not present

## 2021-08-21 DIAGNOSIS — K5909 Other constipation: Secondary | ICD-10-CM | POA: Diagnosis not present

## 2021-08-21 DIAGNOSIS — F4322 Adjustment disorder with anxiety: Secondary | ICD-10-CM | POA: Diagnosis not present

## 2021-11-19 DIAGNOSIS — R102 Pelvic and perineal pain: Secondary | ICD-10-CM | POA: Diagnosis not present

## 2021-11-19 DIAGNOSIS — Z124 Encounter for screening for malignant neoplasm of cervix: Secondary | ICD-10-CM | POA: Diagnosis not present

## 2021-11-19 DIAGNOSIS — R87610 Atypical squamous cells of undetermined significance on cytologic smear of cervix (ASC-US): Secondary | ICD-10-CM | POA: Diagnosis not present

## 2021-12-31 ENCOUNTER — Ambulatory Visit
Admission: RE | Admit: 2021-12-31 | Discharge: 2021-12-31 | Disposition: A | Discharge status: Home / Self Care | Payer: BC Managed Care – PPO | Source: Ambulatory Visit | Attending: Obstetrics and Gynecology | Admitting: Obstetrics and Gynecology

## 2021-12-31 ENCOUNTER — Other Ambulatory Visit: Payer: Self-pay | Admitting: Obstetrics and Gynecology

## 2021-12-31 DIAGNOSIS — N6489 Other specified disorders of breast: Secondary | ICD-10-CM

## 2021-12-31 DIAGNOSIS — R202 Paresthesia of skin: Secondary | ICD-10-CM

## 2021-12-31 DIAGNOSIS — R92332 Mammographic heterogeneous density, left breast: Secondary | ICD-10-CM | POA: Diagnosis not present

## 2022-01-10 DIAGNOSIS — Z1211 Encounter for screening for malignant neoplasm of colon: Secondary | ICD-10-CM | POA: Diagnosis not present

## 2022-02-05 DIAGNOSIS — L301 Dyshidrosis [pompholyx]: Secondary | ICD-10-CM | POA: Diagnosis not present

## 2022-02-06 DIAGNOSIS — K5904 Chronic idiopathic constipation: Secondary | ICD-10-CM | POA: Diagnosis not present

## 2022-03-12 IMAGING — US US BREAST*L* LIMITED INC AXILLA
1 series · 5 of 5 positions shown · non-contrast
Comparison: Previous exam(s).

CLINICAL DATA: 49-year-old female presenting with diffuse left
breast pain.

EXAM:
DIGITAL DIAGNOSTIC UNILATERAL LEFT MAMMOGRAM WITH IMPLANTS, CAD AND
TOMOSYNTHESIS; ULTRASOUND LEFT BREAST LIMITED
TECHNIQUE: Left digital diagnostic mammography and breast tomosynthesis was
performed. The images were evaluated with computer-aided detection.
Standard and/or implant displaced views were performed.; Targeted
ultrasound examination of the left breast was performed.

[Series 1: us breast*left* limited inc axilla · 0.06mm/px · 5 of 5 slices shown]
[im 1/5]
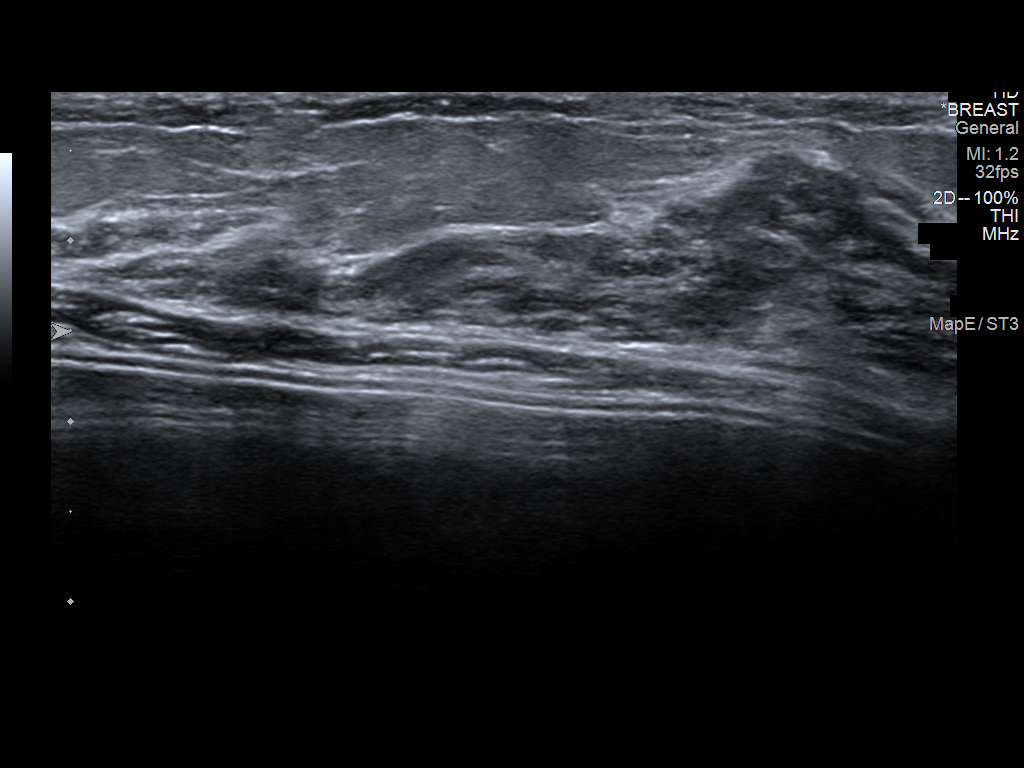
[im 2/5]
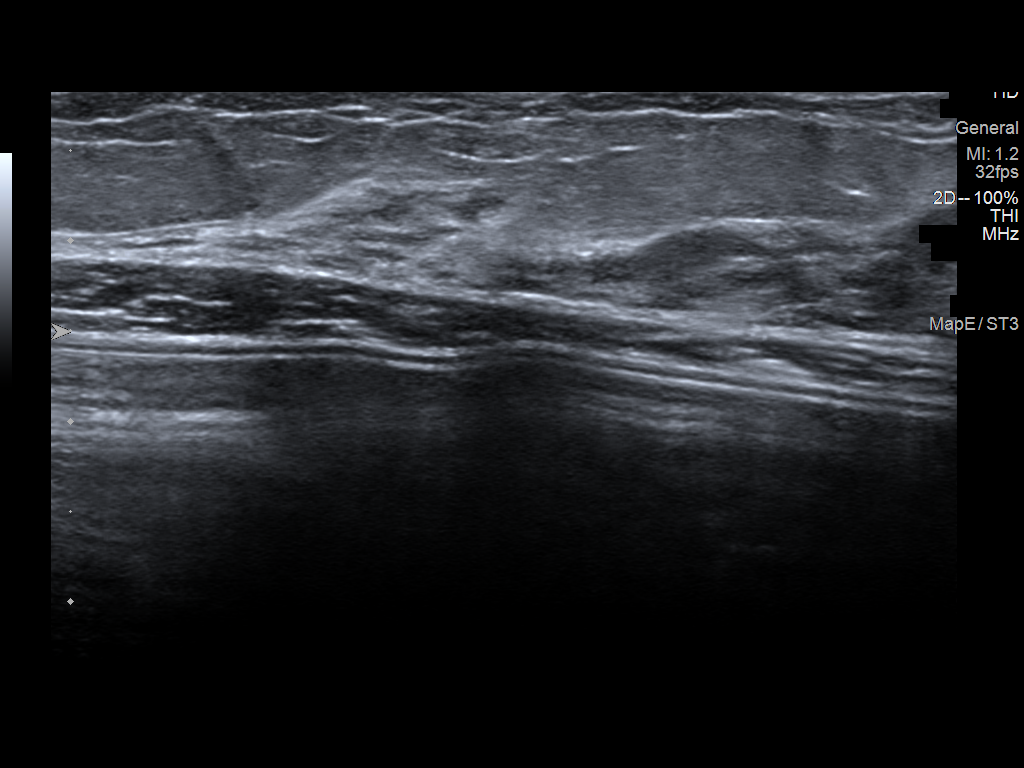
[im 3/5]
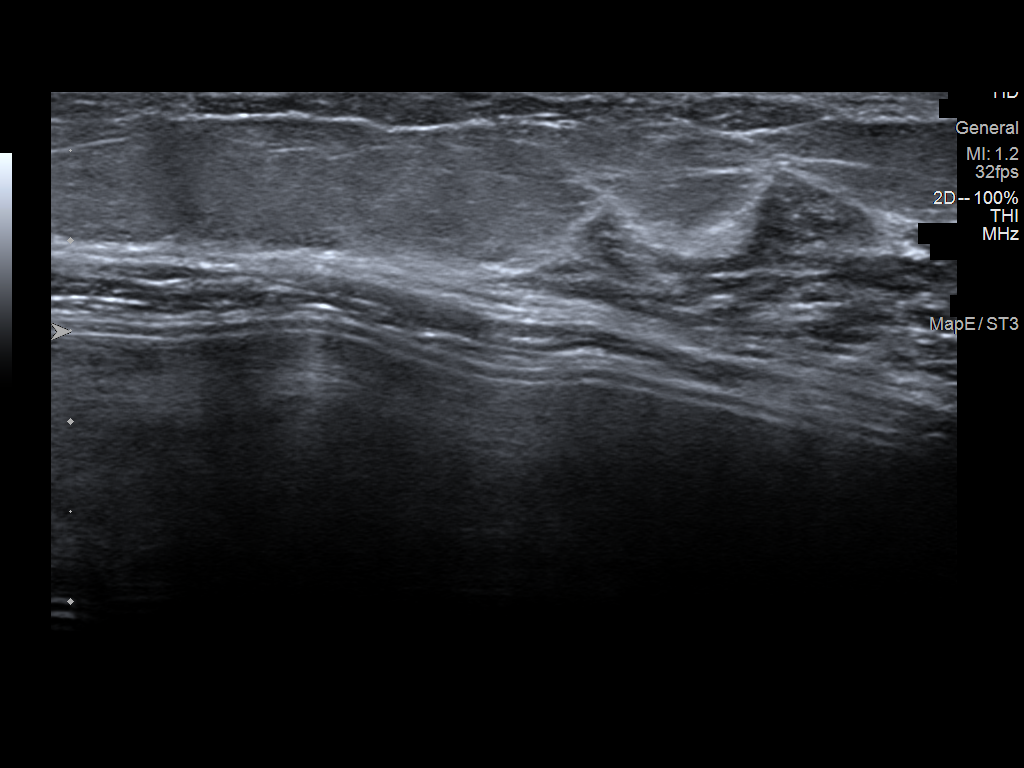
[im 4/5]
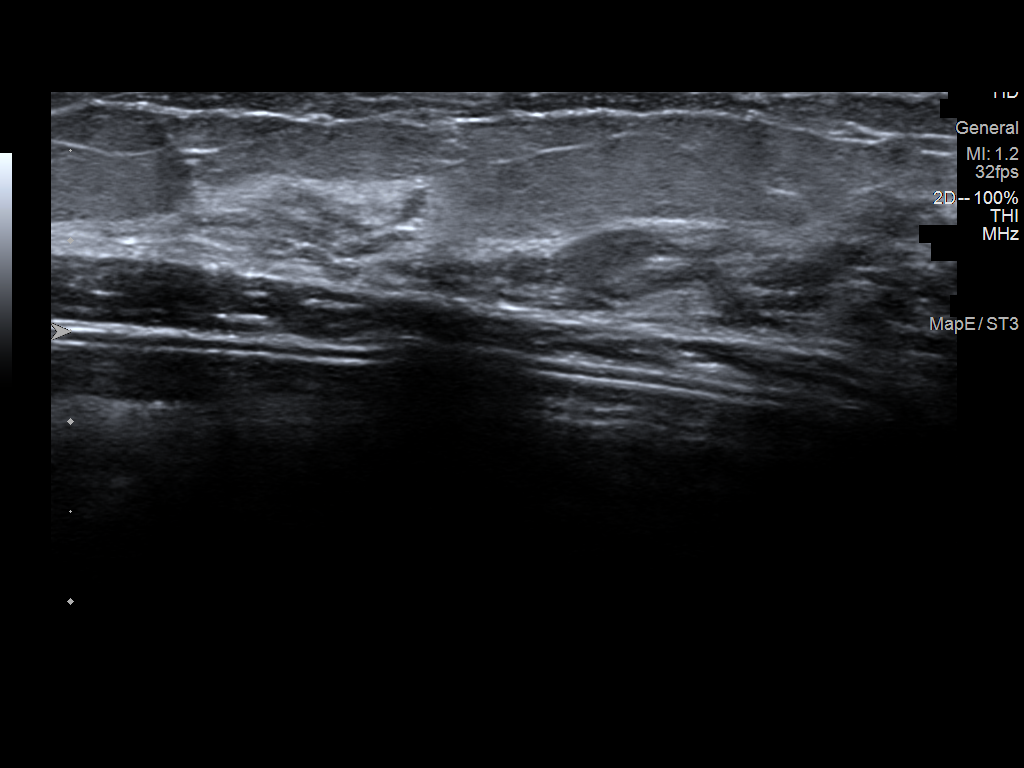
[im 5/5]
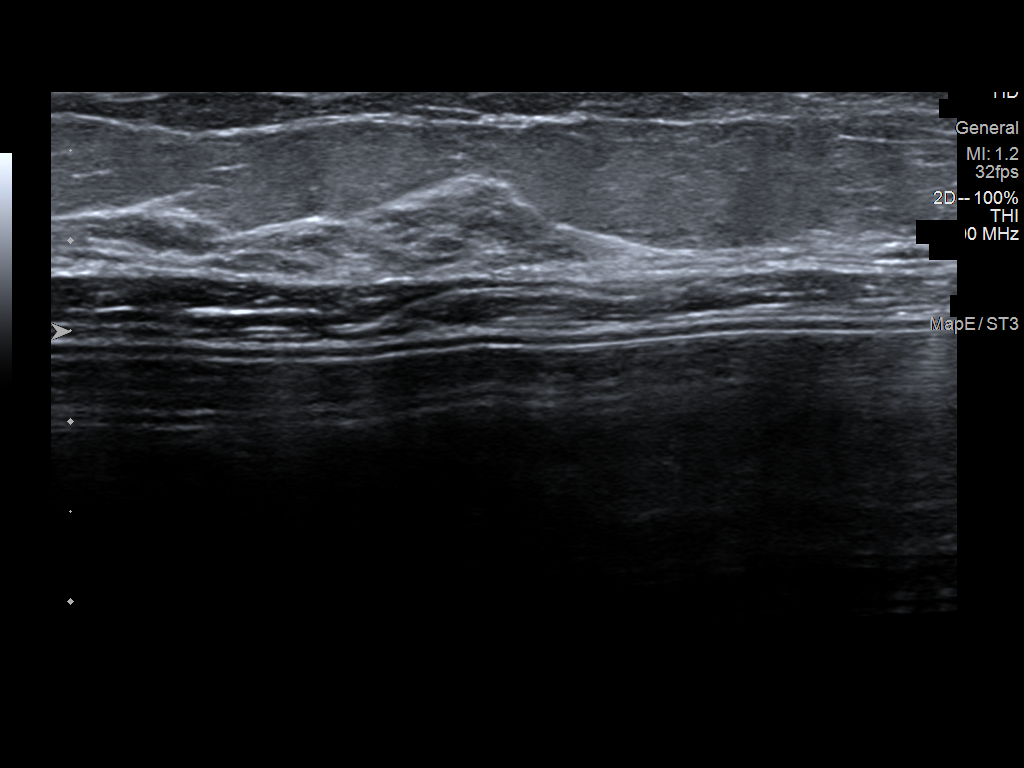

[5 of 5 positions shown; findings below may reference images not displayed]

ACR Breast Density Category d: The breast tissue is extremely dense,
which lowers the sensitivity of mammography.
FINDINGS: Mammogram:

The patient has a retropectoral implant. Full field tomosynthesis
views of the left breast were performed. Additionally a spot
compression tomosynthesis CC view was performed for a possible
asymmetry identified in the superior central left breast posterior
depth. There is a persistent oval asymmetry on the spot imaging
measuring approximately 5 mm. There is no additional finding to
explain the patient's diffuse pain.

Ultrasound:

Targeted ultrasound performed in the superior central left breast
demonstrating no cystic or solid mass to correspond to the
mammographic finding.
IMPRESSION: 1. Probably benign asymmetry in the left breast without sonographic
correlate.

2. No mammographic finding to explain the patient's diffuse breast
pain. Breast pain is a common condition, which will often resolve on
its own without intervention. It can be affected by hormonal
changes, medication side effect, weight changes and fit of the bra.
Pain may also be referred from other adjacent areas of the body.
Breast pain may be improved by wearing adequate well-fitting
support, over-the-counter topical and oral NSAID medication, low-fat
diet, and ice/heat as needed. Studies have shown an improvement in
cyclic pain with use of evening primrose oil and vitamin E.

RECOMMENDATION:
Diagnostic left breast mammogram in 6 months.

I have discussed the findings and recommendations with the patient.
If applicable, a reminder letter will be sent to the patient
regarding the next appointment.

BI-RADS CATEGORY  3: Probably benign.

## 2022-04-09 DIAGNOSIS — K5904 Chronic idiopathic constipation: Secondary | ICD-10-CM | POA: Diagnosis not present

## 2022-04-09 DIAGNOSIS — K5900 Constipation, unspecified: Secondary | ICD-10-CM | POA: Diagnosis not present

## 2022-04-09 DIAGNOSIS — K5902 Outlet dysfunction constipation: Secondary | ICD-10-CM | POA: Diagnosis not present

## 2022-04-10 DIAGNOSIS — L209 Atopic dermatitis, unspecified: Secondary | ICD-10-CM | POA: Diagnosis not present

## 2022-04-10 DIAGNOSIS — L299 Pruritus, unspecified: Secondary | ICD-10-CM | POA: Diagnosis not present

## 2022-04-12 DIAGNOSIS — K5904 Chronic idiopathic constipation: Secondary | ICD-10-CM | POA: Diagnosis not present

## 2022-04-23 DIAGNOSIS — E040 Nontoxic diffuse goiter: Secondary | ICD-10-CM | POA: Diagnosis not present

## 2022-04-23 DIAGNOSIS — E039 Hypothyroidism, unspecified: Secondary | ICD-10-CM | POA: Diagnosis not present

## 2022-04-23 DIAGNOSIS — Z6821 Body mass index (BMI) 21.0-21.9, adult: Secondary | ICD-10-CM | POA: Diagnosis not present

## 2022-05-07 DIAGNOSIS — E049 Nontoxic goiter, unspecified: Secondary | ICD-10-CM | POA: Diagnosis not present

## 2022-05-07 DIAGNOSIS — E079 Disorder of thyroid, unspecified: Secondary | ICD-10-CM | POA: Diagnosis not present

## 2022-05-07 DIAGNOSIS — E040 Nontoxic diffuse goiter: Secondary | ICD-10-CM | POA: Diagnosis not present

## 2022-05-27 ENCOUNTER — Other Ambulatory Visit: Payer: Self-pay | Admitting: Obstetrics and Gynecology

## 2022-05-27 ENCOUNTER — Ambulatory Visit
Admission: RE | Admit: 2022-05-27 | Discharge: 2022-05-27 | Disposition: A | Discharge status: Home / Self Care | Payer: BC Managed Care – PPO | Source: Ambulatory Visit | Attending: Obstetrics and Gynecology | Admitting: Obstetrics and Gynecology

## 2022-05-27 DIAGNOSIS — E039 Hypothyroidism, unspecified: Secondary | ICD-10-CM | POA: Diagnosis not present

## 2022-05-27 DIAGNOSIS — N6489 Other specified disorders of breast: Secondary | ICD-10-CM

## 2022-05-27 DIAGNOSIS — Z6821 Body mass index (BMI) 21.0-21.9, adult: Secondary | ICD-10-CM | POA: Diagnosis not present

## 2022-05-27 DIAGNOSIS — F4322 Adjustment disorder with anxiety: Secondary | ICD-10-CM | POA: Diagnosis not present

## 2022-05-30 DIAGNOSIS — K5904 Chronic idiopathic constipation: Secondary | ICD-10-CM | POA: Diagnosis not present

## 2022-07-29 DIAGNOSIS — Z7689 Persons encountering health services in other specified circumstances: Secondary | ICD-10-CM | POA: Diagnosis not present

## 2022-07-29 DIAGNOSIS — J0141 Acute recurrent pansinusitis: Secondary | ICD-10-CM | POA: Diagnosis not present

## 2022-07-29 DIAGNOSIS — J019 Acute sinusitis, unspecified: Secondary | ICD-10-CM | POA: Diagnosis not present

## 2022-08-20 DIAGNOSIS — Z01419 Encounter for gynecological examination (general) (routine) without abnormal findings: Secondary | ICD-10-CM | POA: Diagnosis not present

## 2022-08-20 DIAGNOSIS — Z124 Encounter for screening for malignant neoplasm of cervix: Secondary | ICD-10-CM | POA: Diagnosis not present

## 2022-08-20 DIAGNOSIS — Z6821 Body mass index (BMI) 21.0-21.9, adult: Secondary | ICD-10-CM | POA: Diagnosis not present

## 2022-08-20 DIAGNOSIS — N760 Acute vaginitis: Secondary | ICD-10-CM | POA: Diagnosis not present

## 2022-08-26 DIAGNOSIS — E039 Hypothyroidism, unspecified: Secondary | ICD-10-CM | POA: Diagnosis not present

## 2022-08-26 DIAGNOSIS — L02416 Cutaneous abscess of left lower limb: Secondary | ICD-10-CM | POA: Diagnosis not present

## 2022-08-26 DIAGNOSIS — Z6821 Body mass index (BMI) 21.0-21.9, adult: Secondary | ICD-10-CM | POA: Diagnosis not present

## 2022-09-24 DIAGNOSIS — N890 Mild vaginal dysplasia: Secondary | ICD-10-CM | POA: Diagnosis not present

## 2022-09-24 DIAGNOSIS — R87629 Unspecified abnormal cytological findings in specimens from vagina: Secondary | ICD-10-CM | POA: Diagnosis not present

## 2022-09-24 DIAGNOSIS — N952 Postmenopausal atrophic vaginitis: Secondary | ICD-10-CM | POA: Diagnosis not present

## 2022-10-22 DIAGNOSIS — Z13820 Encounter for screening for osteoporosis: Secondary | ICD-10-CM | POA: Diagnosis not present

## 2022-10-25 DIAGNOSIS — E063 Autoimmune thyroiditis: Secondary | ICD-10-CM | POA: Diagnosis not present

## 2022-10-25 DIAGNOSIS — E038 Other specified hypothyroidism: Secondary | ICD-10-CM | POA: Diagnosis not present

## 2022-10-25 DIAGNOSIS — Z79890 Hormone replacement therapy (postmenopausal): Secondary | ICD-10-CM | POA: Diagnosis not present

## 2022-11-13 DIAGNOSIS — M545 Low back pain, unspecified: Secondary | ICD-10-CM | POA: Diagnosis not present

## 2022-11-13 DIAGNOSIS — M546 Pain in thoracic spine: Secondary | ICD-10-CM | POA: Diagnosis not present

## 2022-11-13 DIAGNOSIS — M79672 Pain in left foot: Secondary | ICD-10-CM | POA: Diagnosis not present

## 2022-11-26 DIAGNOSIS — R87629 Unspecified abnormal cytological findings in specimens from vagina: Secondary | ICD-10-CM | POA: Diagnosis not present

## 2023-02-14 DIAGNOSIS — R1012 Left upper quadrant pain: Secondary | ICD-10-CM | POA: Diagnosis not present

## 2023-02-14 DIAGNOSIS — K581 Irritable bowel syndrome with constipation: Secondary | ICD-10-CM | POA: Diagnosis not present

## 2023-02-14 DIAGNOSIS — F4322 Adjustment disorder with anxiety: Secondary | ICD-10-CM | POA: Diagnosis not present

## 2023-02-14 DIAGNOSIS — Z1339 Encounter for screening examination for other mental health and behavioral disorders: Secondary | ICD-10-CM | POA: Diagnosis not present

## 2023-02-21 DIAGNOSIS — R1012 Left upper quadrant pain: Secondary | ICD-10-CM | POA: Diagnosis not present

## 2023-03-14 DIAGNOSIS — E063 Autoimmune thyroiditis: Secondary | ICD-10-CM | POA: Diagnosis not present

## 2023-03-24 DIAGNOSIS — Z0000 Encounter for general adult medical examination without abnormal findings: Secondary | ICD-10-CM | POA: Diagnosis not present

## 2023-03-24 DIAGNOSIS — E039 Hypothyroidism, unspecified: Secondary | ICD-10-CM | POA: Diagnosis not present

## 2023-03-24 DIAGNOSIS — Z131 Encounter for screening for diabetes mellitus: Secondary | ICD-10-CM | POA: Diagnosis not present

## 2023-03-25 DIAGNOSIS — Z0000 Encounter for general adult medical examination without abnormal findings: Secondary | ICD-10-CM | POA: Diagnosis not present

## 2023-03-25 DIAGNOSIS — M25649 Stiffness of unspecified hand, not elsewhere classified: Secondary | ICD-10-CM | POA: Diagnosis not present

## 2023-03-25 DIAGNOSIS — K219 Gastro-esophageal reflux disease without esophagitis: Secondary | ICD-10-CM | POA: Diagnosis not present

## 2023-03-25 DIAGNOSIS — D649 Anemia, unspecified: Secondary | ICD-10-CM | POA: Diagnosis not present

## 2023-03-26 ENCOUNTER — Encounter: Payer: Self-pay | Admitting: Neurology

## 2023-03-26 DIAGNOSIS — D649 Anemia, unspecified: Secondary | ICD-10-CM | POA: Diagnosis not present

## 2023-03-28 DIAGNOSIS — E063 Autoimmune thyroiditis: Secondary | ICD-10-CM | POA: Diagnosis not present

## 2023-03-28 DIAGNOSIS — Z23 Encounter for immunization: Secondary | ICD-10-CM | POA: Diagnosis not present

## 2023-03-28 DIAGNOSIS — Z79890 Hormone replacement therapy (postmenopausal): Secondary | ICD-10-CM | POA: Diagnosis not present

## 2023-04-18 ENCOUNTER — Ambulatory Visit: Payer: BC Managed Care – PPO | Admitting: Neurology

## 2023-04-18 DIAGNOSIS — R202 Paresthesia of skin: Secondary | ICD-10-CM | POA: Diagnosis not present

## 2023-04-18 NOTE — Procedures (Signed)
  [facility]  [address], [address]  [place], [place] [address] Tel: [phone] Fax: [phone] Test Date:  04/18/2023  Patient: Jenna Chaney DOB: 21-Feb-1971 Physician: Tonita Blanch, DO  Sex: Female Height: 5' 5 Ref Phys: Delon Contes, MD  ID#: [id number]   Technician:    History: This is a 53 year old female referred for evaluation of right hand paresthesias and abnormal movement.  NCV & EMG Findings: Extensive electrodiagnostic testing of the right upper extremity shows: Right median, ulnar, and mixed palmar sensory responses are within normal limits. Right median and ulnar motor responses are within normal limits. There is no evidence of active or chronic motor axonal loss changes affecting any of the tested muscles.  Motor unit configuration and recruitment pattern is within normal limits.  Impression: This is a normal study of the right upper extremity.  In particular, there is no evidence of a cervical radiculopathy, carpal tunnel syndrome, or ulnar neuropathy.   ___________________________ Tonita Blanch, DO    Nerve Conduction Studies   Stim Site NR Peak (ms) Norm Peak (ms) O-P Amp (V) Norm O-P Amp  Right Median Anti Sensory (2nd Digit)  32 C  Wrist    3.3 <3.6 43.2 >15  Right Ulnar Anti Sensory (5th Digit)  32 C  Wrist    2.6 <3.1 57.1 >10     Stim Site NR Onset (ms) Norm Onset (ms) O-P Amp (mV) Norm O-P Amp Site1 Site2 Delta-0 (ms) Dist (cm) Vel (m/s) Norm Vel (m/s)  Right Median Motor (Abd Poll Brev)  32 C  Wrist    3.4 <4.0 9.8 >6 Elbow Wrist 5.1 30.0 59 >50  Elbow    8.5  9.7         Right Ulnar Motor (Abd Dig Minimi)  32 C  Wrist    2.3 <3.1 9.8 >7 B Elbow Wrist 3.3 21.0 64 >50  B Elbow    5.6  9.6  A Elbow B Elbow 1.7 10.0 59 >50  A Elbow    7.3  9.3            Stim Site NR Peak (ms) Norm Peak (ms) P-T Amp (V) Site1 Site2 Delta-P (ms) Norm Delta (ms)  Right Median/Ulnar Palm Comparison (Wrist - 8cm)  32 C  Median Palm     1.8 <2.2 49.8 Median Palm Ulnar Palm 0.2   Ulnar Palm    1.6 <2.2 30.7       Electromyography   Side Muscle Ins.Act Fibs Fasc Recrt Amp Dur Poly Activation Comment  Right 1stDorInt Nml Nml Nml Nml Nml Nml Nml Nml N/A  Right PronatorTeres Nml Nml Nml Nml Nml Nml Nml Nml N/A  Right Biceps Nml Nml Nml Nml Nml Nml Nml Nml N/A  Right Triceps Nml Nml Nml Nml Nml Nml Nml Nml N/A  Right Deltoid Nml Nml Nml Nml Nml Nml Nml Nml N/A      Waveforms:

## 2023-04-29 DIAGNOSIS — Z6822 Body mass index (BMI) 22.0-22.9, adult: Secondary | ICD-10-CM | POA: Diagnosis not present

## 2023-04-29 DIAGNOSIS — R635 Abnormal weight gain: Secondary | ICD-10-CM | POA: Diagnosis not present

## 2023-04-29 DIAGNOSIS — R87622 Low grade squamous intraepithelial lesion on cytologic smear of vagina (LGSIL): Secondary | ICD-10-CM | POA: Diagnosis not present

## 2023-06-06 DIAGNOSIS — E063 Autoimmune thyroiditis: Secondary | ICD-10-CM | POA: Diagnosis not present

## 2023-06-11 DIAGNOSIS — Z79890 Hormone replacement therapy (postmenopausal): Secondary | ICD-10-CM | POA: Diagnosis not present

## 2023-06-11 DIAGNOSIS — E063 Autoimmune thyroiditis: Secondary | ICD-10-CM | POA: Diagnosis not present

## 2023-08-29 DIAGNOSIS — E063 Autoimmune thyroiditis: Secondary | ICD-10-CM | POA: Diagnosis not present

## 2023-09-08 DIAGNOSIS — Z01419 Encounter for gynecological examination (general) (routine) without abnormal findings: Secondary | ICD-10-CM | POA: Diagnosis not present

## 2023-09-08 DIAGNOSIS — Z1272 Encounter for screening for malignant neoplasm of vagina: Secondary | ICD-10-CM | POA: Diagnosis not present

## 2023-09-08 DIAGNOSIS — Z6821 Body mass index (BMI) 21.0-21.9, adult: Secondary | ICD-10-CM | POA: Diagnosis not present

## 2023-09-10 DIAGNOSIS — Z79890 Hormone replacement therapy (postmenopausal): Secondary | ICD-10-CM | POA: Diagnosis not present

## 2023-09-10 DIAGNOSIS — E063 Autoimmune thyroiditis: Secondary | ICD-10-CM | POA: Diagnosis not present

## 2023-09-25 DIAGNOSIS — M542 Cervicalgia: Secondary | ICD-10-CM | POA: Diagnosis not present

## 2023-09-25 DIAGNOSIS — M5432 Sciatica, left side: Secondary | ICD-10-CM | POA: Diagnosis not present

## 2023-09-25 DIAGNOSIS — K581 Irritable bowel syndrome with constipation: Secondary | ICD-10-CM | POA: Diagnosis not present

## 2023-09-25 DIAGNOSIS — K219 Gastro-esophageal reflux disease without esophagitis: Secondary | ICD-10-CM | POA: Diagnosis not present

## 2024-01-22 ENCOUNTER — Other Ambulatory Visit: Payer: Self-pay | Admitting: Obstetrics and Gynecology

## 2024-01-22 DIAGNOSIS — Z1231 Encounter for screening mammogram for malignant neoplasm of breast: Secondary | ICD-10-CM

## 2024-01-29 ENCOUNTER — Ambulatory Visit (INDEPENDENT_AMBULATORY_CARE_PROVIDER_SITE_OTHER)

## 2024-01-29 ENCOUNTER — Ambulatory Visit (HOSPITAL_COMMUNITY): Payer: Self-pay

## 2024-01-29 ENCOUNTER — Encounter (HOSPITAL_COMMUNITY): Payer: Self-pay

## 2024-01-29 ENCOUNTER — Ambulatory Visit (HOSPITAL_COMMUNITY)
Admission: EM | Admit: 2024-01-29 | Discharge: 2024-01-29 | Disposition: A | Discharge status: Home / Self Care | Attending: Family Medicine | Admitting: Family Medicine

## 2024-01-29 DIAGNOSIS — R079 Chest pain, unspecified: Secondary | ICD-10-CM | POA: Insufficient documentation

## 2024-01-29 LAB — BASIC METABOLIC PANEL WITH GFR
Anion gap: 12 (ref 5–15)
BUN: 20 mg/dL (ref 6–20)
CO2: 26 mmol/L (ref 22–32)
Calcium: 9.2 mg/dL (ref 8.9–10.3)
Chloride: 101 mmol/L (ref 98–111)
Creatinine, Ser: 0.66 mg/dL (ref 0.44–1.00)
GFR, Estimated: >60 mL/min (ref >60)
Glucose, Bld: 103 mg/dL — ABNORMAL HIGH (ref 70–99)
Potassium: 4.2 mmol/L (ref 3.5–5.1)
Sodium: 139 mmol/L (ref 135–145)

## 2024-01-29 LAB — CBC WITH DIFFERENTIAL/PLATELET
Abs Immature Granulocytes: 0.02 K/uL (ref 0.00–0.07)
Basophils Absolute: 0 K/uL (ref 0.0–0.1)
Basophils Relative: 0 %
Eosinophils Absolute: 0.2 K/uL (ref 0.0–0.5)
Eosinophils Relative: 2 %
HCT: 37 % (ref 36.0–46.0)
Hemoglobin: 12.2 g/dL (ref 12.0–15.0)
Immature Granulocytes: 0 %
Lymphocytes Relative: 14 %
Lymphs Abs: 1 K/uL (ref 0.7–4.0)
MCH: 30.7 pg (ref 26.0–34.0)
MCHC: 33 g/dL (ref 30.0–36.0)
MCV: 93 fL (ref 80.0–100.0)
Monocytes Absolute: 0.4 K/uL (ref 0.1–1.0)
Monocytes Relative: 6 %
Neutro Abs: 5.5 K/uL (ref 1.7–7.7)
Neutrophils Relative %: 78 %
Platelets: 321 K/uL (ref 150–400)
RBC: 3.98 MIL/uL (ref 3.87–5.11)
RDW: 12.5 % (ref 11.5–15.5)
WBC: 7.2 K/uL (ref 4.0–10.5)
nRBC: 0 % (ref 0.0–0.2)

## 2024-01-29 LAB — D-DIMER, QUANTITATIVE: D-Dimer, Quant: <0.27 ug{FEU}/mL (ref 0.00–0.50)

## 2024-01-29 NOTE — ED Triage Notes (Addendum)
 Patient reports intermittent left chest pain. Today the chest pain started around 0700 and states she finds it hard to get a breath at times.  Patient states she took a BC headache powder at 0730 for c/o headache.

## 2024-01-29 NOTE — Telephone Encounter (Signed)
 Pt informed of normal labs.

## 2024-01-29 NOTE — ED Provider Notes (Signed)
 [facility]   [id number] 01/29/24 Arrival Time: 0915  ASSESSMENT & PLAN:  1. Chest pain, unspecified type    Labs Pending:  CBC WITH DIFFERENTIAL/PLATELET  BASIC METABOLIC PANEL WITH GFR  D-DIMER, QUANTITATIVE   Declines ED evaluation for troponin. Currently asymptomatic.  ECG: Performed today and interpreted by me: normal EKG, normal sinus rhythm; no STEMI.  I have personally viewed the imaging studies ordered this visit. CXR: no acute changes/pneumothorax appreciated.  Cardiology referral placed. Orders Placed This Encounter  Procedures   Ambulatory referral to Cardiology    Referral Priority:   Urgent    Referral Type:   Consultation    Referral Reason:   Specialty Services Required    Number of Visits Requested:   1     Discharge Instructions      You have been seen at the [facility] today for chest pain. Your evaluation today was not suggestive of any emergent condition requiring medical intervention at this time. Your chest x-ray and ECG (heart tracing) did not show any worrisome changes. However, some medical problems make take more time to appear. Therefore, it's very important that you pay attention to any new symptoms or worsening of your current condition.  Please proceed directly to the Emergency Department immediately should you feel worse in any way or have any of the following symptoms: increasing or different chest pain, pain that spreads to your arm, neck, jaw, back or abdomen, shortness of breath, or nausea and vomiting.  You have had labs (blood tests) sent today. We will call you with any significant abnormalities or if there is need to begin or change treatment or pursue further follow up.  You may also review your test results online through MyChart. If you do not have a MyChart account, instructions to sign up should be on your discharge paperwork.      Reviewed expectations re: course of current medical issues. Questions  answered. Outlined signs and symptoms indicating need for more acute intervention. Patient verbalized understanding. After Visit Summary given.   SUBJECTIVE:  History from: patient. Jenna Chaney is a 53 y.o. female who presents with complaint of intermittent non-radiating, upper, mostly L-sided CP; on/off; first noted 2 weeks ago. One significant episode 4 d ago described as abrupt chest pain that made me gasp; lasted about one minute then eased; rest of night just didn't feel well. Denies associated n/v/d. Questions if she was a bit SOB with the acute pain but denies feeling SOB the rest of the evening. Denies strenuous activity. Has flown recently. Denies LE edema/swelling/pain. Denies h/o acid reflux/GI issues. Mild pain this morning that prompted her to come here. Has eased.  Social History   Tobacco Use  Smoking Status Never  Smokeless Tobacco Never   Social History   Substance and Sexual Activity  Alcohol Use Yes   Comment: 2 glasses of wine daily   Denies recreational drug use.  OBJECTIVE:  Vitals:   01/29/24 0938  BP: 123/75  Pulse: 76  Resp: 16  Temp: 97.9 F (36.6 C)  TempSrc: Oral  SpO2: 97%    General appearance: alert, oriented, no acute distress Eyes: PERRLA; EOMI; conjunctivae normal HENT: normocephalic; atraumatic Neck: supple with FROM Lungs: without labored respirations; speaks full sentences without difficulty; CTAB Heart: regular rate and rhythm without murmer Chest Wall: without tenderness to palpation Extremities: without edema; without calf swelling or tenderness; symmetrical without gross deformities Skin: warm and dry; without rash or lesions Neuro: normal  gait Psychological: alert and cooperative; normal mood and affect  Labs:  Labs Reviewed  CBC WITH DIFFERENTIAL/PLATELET  BASIC METABOLIC PANEL WITH GFR  D-DIMER, QUANTITATIVE    Imaging: DG Chest 2 View Result Date: 01/29/2024 CLINICAL DATA:  Chest pain. EXAM: CHEST - 2  VIEW COMPARISON:  12/12/2009 FINDINGS: The heart size and mediastinal contours are within normal limits. No focal consolidation, pleural effusion, or pneumothorax. Cholecystectomy clips in the right upper quadrant. No acute osseous abnormality. IMPRESSION: No acute cardiopulmonary findings. Electronically Signed   By: Harrietta Sherry M.D.   On: 01/29/2024 11:04     Allergies  Allergen Reactions   Tramadol Shortness Of Breath    Chest pains   Codeine Nausea And Vomiting    Past Medical History:  Diagnosis Date   Anxiety    PONV (postoperative nausea and vomiting)    Writer's cramp    Social History   Socioeconomic History   Marital status: Divorced    Spouse name: Not on file   Number of children: Not on file   Years of education: Not on file   Highest education level: Not on file  Occupational History   Not on file  Tobacco Use   Smoking status: Never   Smokeless tobacco: Never  Vaping Use   Vaping status: Never Used  Substance and Sexual Activity   Alcohol use: Yes    Comment: 2 glasses of wine daily   Drug use: No   Sexual activity: Not on file  Other Topics Concern   Not on file  Social History Narrative   Not on file   Social Drivers of Health   Financial Resource Strain: Not on file  Food Insecurity: Not on file  Transportation Needs: Not on file  Physical Activity: Not on file  Stress: Not on file  Social Connections: Not on file  Intimate Partner Violence: Not on file   Family History  Problem Relation Age of Onset   Breast cancer Mother 34   Past Surgical History:  Procedure Laterality Date   ABDOMINAL HYSTERECTOMY     AUGMENTATION MAMMAPLASTY     2004   BREAST ENHANCEMENT SURGERY     CHOLECYSTECTOMY     DIAGNOSTIC LAPAROSCOPY     times 2   LAPAROSCOPIC VAGINAL HYSTERECTOMY WITH SALPINGECTOMY Bilateral 01/13/2017   Procedure: LAPAROSCOPIC ASSISTED VAGINAL HYSTERECTOMY WITH BILATERAL SALPINGECTOMY;  Surgeon: Mat Browning, MD;  Location:  [facility] [place];  Service: Gynecology;  Laterality: Bilateral;  and vagina      Rolinda Rogue, MD 01/29/24 1145

## 2024-01-29 NOTE — Discharge Instructions (Signed)
 You have been seen at the [facility] today for chest pain. Your evaluation today was not suggestive of any emergent condition requiring medical intervention at this time. Your chest x-ray and ECG (heart tracing) did not show any worrisome changes. However, some medical problems make take more time to appear. Therefore, it's very important that you pay attention to any new symptoms or worsening of your current condition.  Please proceed directly to the Emergency Department immediately should you feel worse in any way or have any of the following symptoms: increasing or different chest pain, pain that spreads to your arm, neck, jaw, back or abdomen, shortness of breath, or nausea and vomiting.  You have had labs (blood tests) sent today. We will call you with any significant abnormalities or if there is need to begin or change treatment or pursue further follow up.  You may also review your test results online through MyChart. If you do not have a MyChart account, instructions to sign up should be on your discharge paperwork.

## 2024-02-17 ENCOUNTER — Ambulatory Visit

## 2024-03-16 ENCOUNTER — Ambulatory Visit
Admission: RE | Admit: 2024-03-16 | Discharge: 2024-03-16 | Disposition: A | Source: Ambulatory Visit | Attending: Obstetrics and Gynecology | Admitting: Obstetrics and Gynecology

## 2024-03-16 DIAGNOSIS — Z1231 Encounter for screening mammogram for malignant neoplasm of breast: Secondary | ICD-10-CM

## 2024-04-12 NOTE — Progress Notes (Unsigned)
 " Cardiology Office Note:  .   Date:  04/13/2024  ID:  ARRYN TERRONES, DOB May 11, 1970, MRN [id number] PCP: Clemmie Nest, MD  [facility] Providers Cardiologist:  None   History of Present Illness: .    Chief Complaint  Patient presents with   Chest Pain    Jenna Chaney is a 54 y.o. female with below history who presents for the evaluation of chest pain at the request of Rolinda Rogue, MD. Seen urgent care 01/29/2024 for CP with negative work-up.     History of Present Illness   Jenna Chaney is a 54 year old female who presents with chest pain. She was referred by the emergency room for evaluation of chest pain.  She has been experiencing sharp chest pain since November 20th, primarily located in the left chest without radiation to the arm or jaw. The pain occurs randomly, typically during the day, and lasts about thirty minutes. There are no specific triggers, and it resolves with time. She experiences a sensation of not feeling right and difficulty taking a deep breath at times. No associated nausea or vomiting.  Her medical history includes Hashimoto's thyroiditis. She denies any other significant medical problems such as hypertension, diabetes, heart attack, or stroke. Her past surgical history includes a hysterectomy, breast surgery, and gallbladder removal.  Family history is notable for her father having had multiple heart attacks. She does not smoke and consumes alcohol on weekends. 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           ROS: All other ROS reviewed and negative. Pertinent positives noted in the HPI.     Studies Reviewed: SABRA   EKG Interpretation Date/Time:  Tuesday April 13 2024 16:00:51 EST Ventricular Rate:  71 PR Interval:  154 QRS Duration:  74 QT Interval:  380 QTC Calculation: 412 R Axis:   61  Text Interpretation: Normal sinus rhythm Normal ECG Confirmed by Barbaraann Kotyk [phone]) on  04/13/2024 4:02:52 PM    EKG NSR 71, no acute changes   EKG 01/29/2024: NSR 78 bpm, no acute changes  Physical Exam:   VS:  BP 118/72   Pulse 84   Ht 5' 5 (1.651 m)   Wt 135 lb 11.2 oz (61.6 kg)   LMP 10/09/2016 Comment: 3 month cycle  SpO2 97%   BMI 22.58 kg/m    Wt Readings from Last 3 Encounters:  04/13/24 135 lb 11.2 oz (61.6 kg)  01/13/17 128 lb (58.1 kg)  01/09/17 132 lb 6 oz (60 kg)    GEN: Well nourished, well developed in no acute distress NECK: No JVD; No carotid bruits CARDIAC: RRR, no murmurs, rubs, gallops RESPIRATORY:  Clear to auscultation without rales, wheezing or rhonchi  ABDOMEN: Soft, non-tender, non-distended EXTREMITIES:  No edema; No deformity  ASSESSMENT AND PLAN: .   Assessment and Plan    Precordial pain Intermittent sharp chest pain, negative ER workup, likely non-cardiac. Considered acid reflux, musculoskeletal, stress-related pain. Family history of heart disease noted. - EKG today is normal which is reassuring.  - Ordered coronary CTA. - BMP today.  - Administered 100 mg metoprolol  before CTA. - Discussed non-cardiac causes. - Scheduled follow-up based on results.                Follow-up: Return if symptoms worsen or fail to improve.  Signed, Kotyk DASEN. Barbaraann, MD, [facility] [place]  [facility]  [address]  [place], [place] [address] [phone]  7:23 PM   "

## 2024-04-13 ENCOUNTER — Ambulatory Visit (HOSPITAL_BASED_OUTPATIENT_CLINIC_OR_DEPARTMENT_OTHER): Admitting: Cardiovascular Disease

## 2024-04-13 ENCOUNTER — Encounter (HOSPITAL_BASED_OUTPATIENT_CLINIC_OR_DEPARTMENT_OTHER): Payer: Self-pay | Admitting: Cardiovascular Disease

## 2024-04-13 VITALS — BP 118/72 | HR 84 | Ht 65.0 in | Wt 135.7 lb

## 2024-04-13 DIAGNOSIS — R072 Precordial pain: Secondary | ICD-10-CM

## 2024-04-13 LAB — BASIC METABOLIC PANEL WITH GFR
BUN/Creatinine Ratio: 29 — ABNORMAL HIGH (ref 9–23)
BUN: 23 mg/dL (ref 6–24)
CO2: 22 mmol/L (ref 20–29)
Calcium: 9.6 mg/dL (ref 8.7–10.2)
Chloride: 104 mmol/L (ref 96–106)
Creatinine, Ser: 0.8 mg/dL (ref 0.57–1.00)
Glucose: 89 mg/dL (ref 70–99)
Potassium: 4.4 mmol/L (ref 3.5–5.2)
Sodium: 141 mmol/L (ref 134–144)
eGFR: 89 mL/min/{1.73_m2}

## 2024-04-13 MED ORDER — METOPROLOL TARTRATE 100 MG PO TABS: ORAL_TABLET | ORAL | 0 refills | Status: AC

## 2024-04-14 ENCOUNTER — Ambulatory Visit: Payer: Self-pay | Admitting: Cardiovascular Disease
